# Patient Record
Sex: Female | Born: 1970
Health system: Southern US, Community
[De-identification: ages and names within clinical notes are randomized; demographics above are authoritative.]

## PROBLEM LIST (undated history)

## (undated) DIAGNOSIS — K219 Gastro-esophageal reflux disease without esophagitis: Secondary | ICD-10-CM

## (undated) DIAGNOSIS — G709 Myoneural disorder, unspecified: Secondary | ICD-10-CM

## (undated) DIAGNOSIS — IMO0002 Reserved for concepts with insufficient information to code with codable children: Secondary | ICD-10-CM

## (undated) DIAGNOSIS — F419 Anxiety disorder, unspecified: Secondary | ICD-10-CM

## (undated) DIAGNOSIS — K5790 Diverticulosis of intestine, part unspecified, without perforation or abscess without bleeding: Secondary | ICD-10-CM

## (undated) HISTORY — DX: Reserved for concepts with insufficient information to code with codable children: IMO0002

## (undated) HISTORY — DX: Myoneural disorder, unspecified: G70.9

## (undated) HISTORY — DX: Gastro-esophageal reflux disease without esophagitis: K21.9

## (undated) HISTORY — DX: Anxiety disorder, unspecified: F41.9

## (undated) HISTORY — PX: TUBAL LIGATION: SHX77

## (undated) HISTORY — DX: Diverticulosis of intestine, part unspecified, without perforation or abscess without bleeding: K57.90

---

## 2001-01-15 ENCOUNTER — Encounter: Payer: Self-pay | Admitting: Emergency Medicine

## 2001-01-15 ENCOUNTER — Emergency Department (HOSPITAL_COMMUNITY): Admission: EM | Admit: 2001-01-15 | Discharge: 2001-01-15 | Payer: Self-pay | Admitting: Emergency Medicine

## 2001-10-23 ENCOUNTER — Ambulatory Visit (HOSPITAL_COMMUNITY): Admission: RE | Admit: 2001-10-23 | Discharge: 2001-10-23 | Payer: Self-pay | Admitting: Internal Medicine

## 2001-10-23 ENCOUNTER — Encounter: Payer: Self-pay | Admitting: Internal Medicine

## 2001-10-30 ENCOUNTER — Other Ambulatory Visit: Admission: RE | Admit: 2001-10-30 | Discharge: 2001-10-30 | Payer: Self-pay | Admitting: Internal Medicine

## 2004-07-21 ENCOUNTER — Other Ambulatory Visit: Admission: RE | Admit: 2004-07-21 | Discharge: 2004-07-21 | Payer: Self-pay | Admitting: Internal Medicine

## 2005-03-13 ENCOUNTER — Encounter: Admission: RE | Admit: 2005-03-13 | Discharge: 2005-03-13 | Payer: Self-pay | Admitting: Orthopedic Surgery

## 2005-03-27 ENCOUNTER — Encounter: Admission: RE | Admit: 2005-03-27 | Discharge: 2005-03-27 | Payer: Self-pay | Admitting: Orthopedic Surgery

## 2005-04-24 ENCOUNTER — Encounter: Admission: RE | Admit: 2005-04-24 | Discharge: 2005-04-24 | Payer: Self-pay | Admitting: Orthopedic Surgery

## 2005-06-08 ENCOUNTER — Encounter: Admission: RE | Admit: 2005-06-08 | Discharge: 2005-06-08 | Payer: Self-pay | Admitting: Orthopedic Surgery

## 2005-06-19 HISTORY — PX: SPINE SURGERY: SHX786

## 2005-09-28 ENCOUNTER — Ambulatory Visit (HOSPITAL_COMMUNITY): Admission: RE | Admit: 2005-09-28 | Discharge: 2005-09-28 | Payer: Self-pay | Admitting: Neurological Surgery

## 2007-08-30 ENCOUNTER — Encounter: Admission: RE | Admit: 2007-08-30 | Discharge: 2007-08-30 | Payer: Self-pay | Admitting: Internal Medicine

## 2010-11-04 NOTE — Op Note (Signed)
NAME:  Brenda Wilson, Brenda Wilson             ACCOUNT NO.:  [id number]   MEDICAL RECORD NO.:  [id number]          PATIENT TYPE:  AMB   LOCATION:  SDS                          FACILITY:  [facility]   PHYSICIAN:  Tia Alert, MD     DATE OF BIRTH:  03-08-1971   DATE OF PROCEDURE:  09/28/2005  DATE OF DISCHARGE:                                 OPERATIVE REPORT   PREOPERATIVE DIAGNOSES:  Lumbar spinal stenosis with lumbar disk herniation,  L4-5 with left leg pain.   POSTOPERATIVE DIAGNOSES:  Lumbar spinal stenosis with lumbar disk  herniation, L4-5 with left leg pain.   OPERATION PERFORMED:  Lumbar decompressive hemilaminectomy, medial  facetectomy, foraminotomy followed by microdiskectomy, L4-5 on the left  utilizing microscopic dissection.   SURGEON:  Tia Alert, MD   ASSISTANT:  Reinaldo Meeker, M.D.   ANESTHESIA:  General endotracheal.   COMPLICATIONS:  None apparent.   INDICATIONS FOR PROCEDURE:  Brenda Wilson is a 40 year old white female who  presented with a long history of left leg pain.  She had an MRI which showed  spinal stenosis at L4-5 from ligamentum flavum overgrowth, facet hypertrophy  and a broad based disk bulge.  She had tried medical management for quite  some time without significant relief.  I recommended a lumbar decompressive  laminectomy followed by microdiskectomy at L4-5 on the left.  The patient  understood the risks, benefits and expected outcome and wished to proceed.   DESCRIPTION OF PROCEDURE:  The patient was taken to the operating room and  after induction of adequate general endotracheal anesthesia, was rolled into  the prone position over the Wilson frame and all pressure points were  padded.  The lumbar region was prepped with DuraPrep and then draped in the  usual sterile fashion.  8 mL of local anesthesia was injected and a dorsal  midline incision was made and carried down to the lumbosacral fascia.  The  fascia was opened on the patient's left  side and taken down in a  subperiosteal fashion to expose L4-5 interspace on the left. Intraoperative  x-ray confirmed our level and then hemilaminectomy, medial facetectomy and  foraminotomy was performed on the left side utilizing Kerrison punches and  high speed drill.  The yellow ligament was opened and removed in piecemeal  fashion to expose the underlying dura and L5 nerve root.  I dissected down  into the foramen past the level of the pedicle and along the medial pedicle  wall to perform a decompression.  I also drilled up under the spinous  process and decompressed the central canal by removing the yellow ligament  from the central canal in a sublaminar fashion.  I was then able to retract  the nerve root medially and coagulate the epidural venous vasculature.  I  found a subannular disk herniation which I incised with a 15 blade scalpel  and performed a thorough intradiskal diskectomy with pituitary rongeurs and  curettes.  Once the diskectomy was completed, I palpated with a nerve root  into the midline and into the foramen to assure adequate decompression.  The  annulus was sagging in the midline.  Felt like we had a good decompression  of the central canal and of the left lateral recess and the L5 nerve root.  We then irrigated with saline solution which contained bacitracin, lined the  dura with Duragen and Gelfoam and then checked for any bleeding points after  we removed the retractor and then closed the fascia with interrupted #1  Vicryl, closed the subcutaneous tissue and subcuticular tissue with 2-0 and  3-0 Vicryl and  closed the skin with Dermabond.  The drapes were removed.  The patient was  awakened from general anesthesia and transported to the recovery room in  stable condition.  At the end of the procedure, all sponge, needle and  instrument counts were correct.      Tia Alert, MD  Electronically Signed     DSJ/MEDQ  D:  09/28/2005  T:  09/28/2005   Job:  [id number]

## 2013-05-30 ENCOUNTER — Encounter: Payer: Self-pay | Admitting: Internal Medicine

## 2013-05-30 ENCOUNTER — Other Ambulatory Visit: Payer: Self-pay | Admitting: Internal Medicine

## 2013-05-30 DIAGNOSIS — M545 Low back pain, unspecified: Secondary | ICD-10-CM

## 2013-05-30 MED ORDER — CYCLOBENZAPRINE HCL 10 MG PO TABS: ORAL_TABLET | ORAL | Status: DC

## 2013-06-06 ENCOUNTER — Other Ambulatory Visit: Payer: Self-pay | Admitting: Physician Assistant

## 2013-06-06 NOTE — Telephone Encounter (Signed)
RX called in .

## 2013-07-03 ENCOUNTER — Encounter: Payer: Self-pay | Admitting: Physician Assistant

## 2013-07-28 ENCOUNTER — Other Ambulatory Visit: Payer: Self-pay | Admitting: Physician Assistant

## 2013-07-30 ENCOUNTER — Ambulatory Visit (INDEPENDENT_AMBULATORY_CARE_PROVIDER_SITE_OTHER): Payer: PRIVATE HEALTH INSURANCE | Admitting: Physician Assistant

## 2013-07-30 ENCOUNTER — Encounter: Payer: Self-pay | Admitting: Physician Assistant

## 2013-07-30 ENCOUNTER — Other Ambulatory Visit (HOSPITAL_COMMUNITY)
Admission: RE | Admit: 2013-07-30 | Discharge: 2013-07-30 | Disposition: A | Discharge status: Home / Self Care | Payer: PRIVATE HEALTH INSURANCE | Source: Ambulatory Visit | Attending: Physician Assistant | Admitting: Physician Assistant

## 2013-07-30 VITALS — BP 98/52 | HR 72 | Temp 98.2°F | Resp 16 | Ht 64.5 in | Wt 132.8 lb

## 2013-07-30 DIAGNOSIS — M109 Gout, unspecified: Secondary | ICD-10-CM

## 2013-07-30 DIAGNOSIS — Z124 Encounter for screening for malignant neoplasm of cervix: Secondary | ICD-10-CM

## 2013-07-30 DIAGNOSIS — N3000 Acute cystitis without hematuria: Secondary | ICD-10-CM

## 2013-07-30 DIAGNOSIS — Z0000 Encounter for general adult medical examination without abnormal findings: Secondary | ICD-10-CM

## 2013-07-30 DIAGNOSIS — Z1151 Encounter for screening for human papillomavirus (HPV): Secondary | ICD-10-CM | POA: Insufficient documentation

## 2013-07-30 DIAGNOSIS — Z01419 Encounter for gynecological examination (general) (routine) without abnormal findings: Secondary | ICD-10-CM | POA: Insufficient documentation

## 2013-07-30 DIAGNOSIS — R748 Abnormal levels of other serum enzymes: Secondary | ICD-10-CM

## 2013-07-30 DIAGNOSIS — Z23 Encounter for immunization: Secondary | ICD-10-CM

## 2013-07-30 LAB — LIPID PANEL
Cholesterol: 156 mg/dL (ref 0–200)
HDL: 58 mg/dL (ref >39)
LDL CALC: 73 mg/dL (ref 0–99)
TRIGLYCERIDES: 127 mg/dL (ref <150)
Total CHOL/HDL Ratio: 2.7 Ratio
VLDL: 25 mg/dL (ref 0–40)

## 2013-07-30 LAB — HEPATIC FUNCTION PANEL
ALBUMIN: 4.2 g/dL (ref 3.5–5.2)
ALT: 26 U/L (ref 0–35)
AST: 31 U/L (ref 0–37)
Alkaline Phosphatase: 62 U/L (ref 39–117)
Bilirubin, Direct: 0.1 mg/dL (ref 0.0–0.3)
Indirect Bilirubin: 0.2 mg/dL (ref 0.2–1.2)
TOTAL PROTEIN: 6.6 g/dL (ref 6.0–8.3)
Total Bilirubin: 0.3 mg/dL (ref 0.2–1.2)

## 2013-07-30 LAB — CBC WITH DIFFERENTIAL/PLATELET
BASOS ABS: 0 10*3/uL (ref 0.0–0.1)
Basophils Relative: 1 % (ref 0–1)
EOS ABS: 0.1 10*3/uL (ref 0.0–0.7)
Eosinophils Relative: 2 % (ref 0–5)
HEMATOCRIT: 39.4 % (ref 36.0–46.0)
Hemoglobin: 13.2 g/dL (ref 12.0–15.0)
LYMPHS ABS: 1.3 10*3/uL (ref 0.7–4.0)
LYMPHS PCT: 30 % (ref 12–46)
MCH: 28.6 pg (ref 26.0–34.0)
MCHC: 33.5 g/dL (ref 30.0–36.0)
MCV: 85.5 fL (ref 78.0–100.0)
MONO ABS: 0.2 10*3/uL (ref 0.1–1.0)
Monocytes Relative: 6 % (ref 3–12)
NEUTROS ABS: 2.6 10*3/uL (ref 1.7–7.7)
Neutrophils Relative %: 61 % (ref 43–77)
Platelets: 239 10*3/uL (ref 150–400)
RBC: 4.61 MIL/uL (ref 3.87–5.11)
RDW: 13.7 % (ref 11.5–15.5)
WBC: 4.2 10*3/uL (ref 4.0–10.5)

## 2013-07-30 LAB — IRON AND TIBC
%SAT: 17 % — AB (ref 20–55)
Iron: 54 ug/dL (ref 42–145)
TIBC: 319 ug/dL (ref 250–470)
UIBC: 265 ug/dL (ref 125–400)

## 2013-07-30 LAB — HEPATITIS B CORE ANTIBODY, TOTAL: HEP B C TOTAL AB: NONREACTIVE

## 2013-07-30 LAB — BASIC METABOLIC PANEL WITH GFR
BUN: 6 mg/dL (ref 6–23)
CALCIUM: 8.9 mg/dL (ref 8.4–10.5)
CHLORIDE: 106 meq/L (ref 96–112)
CO2: 26 mEq/L (ref 19–32)
Creat: 0.66 mg/dL (ref 0.50–1.10)
GFR, Est African American: >89 mL/min
GLUCOSE: 86 mg/dL (ref 70–99)
Potassium: 4.2 mEq/L (ref 3.5–5.3)
SODIUM: 139 meq/L (ref 135–145)

## 2013-07-30 LAB — MAGNESIUM: MAGNESIUM: 2.1 mg/dL (ref 1.5–2.5)

## 2013-07-30 LAB — HEPATITIS A ANTIBODY, TOTAL: HEP A TOTAL AB: NONREACTIVE

## 2013-07-30 LAB — TSH: TSH: 1.584 u[IU]/mL (ref 0.350–4.500)

## 2013-07-30 LAB — VITAMIN B12: VITAMIN B 12: 613 pg/mL (ref 211–911)

## 2013-07-30 LAB — FERRITIN: Ferritin: 32 ng/mL (ref 10–291)

## 2013-07-30 LAB — HEMOGLOBIN A1C
Hgb A1c MFr Bld: 5.4 % (ref <5.7)
MEAN PLASMA GLUCOSE: 108 mg/dL (ref <117)

## 2013-07-30 LAB — HEPATITIS C ANTIBODY: HCV Ab: NEGATIVE

## 2013-07-30 LAB — URIC ACID: Uric Acid, Serum: 3.1 mg/dL (ref 2.4–7.0)

## 2013-07-30 LAB — HEPATITIS B SURFACE ANTIBODY,QUALITATIVE: Hep B S Ab: NEGATIVE

## 2013-07-30 NOTE — Progress Notes (Signed)
Complete Physical HPI 43 y.o. female  presents for a complete physical. She has not had a physical in several years.  Her blood pressure has been controlled at home, today their BP is BP: 98/52 mmHg She denies chest pain, shortness of breath, dizziness.  Her cholesterol is diet controlled.  Her cholesterol is controlled. The cholesterol last visit was: 6797  She has been working on diet and exercise for prediabetes, and denies blurry vision, polydipsia, polyphagia and polyuria. Last A1C in the office was: 5.7 Patient is on Vitamin D supplement. 27.9 LMP 3 weeks ago. + sexually active but has TBL  Current Medications:  Current Outpatient Prescriptions on File Prior to Visit  Medication Sig Dispense Refill  . acyclovir (ZOVIRAX) 800 MG tablet Take 800 mg by mouth as needed.      . ALPRAZolam (XANAX) 0.5 MG tablet TAKE ONE TABLET BY MOUTH THREE TIMES DAILY AS NEEDED  90 tablet  0  . cyclobenzaprine (FLEXERIL) 10 MG tablet 1/2 to 1 tablet 3 x daily for muscle spasm  90 tablet  0   No current facility-administered medications on file prior to visit.   Health Maintenance:  Tetanus: 1998 Pneumovax: N/A Flu vaccine: declines Zostavax: N/A Pap: 2009 history of abnormal pap 20+ years ago, normal since then.  MGM: 08/2007 DEXA: N/A Colonoscopy: N/A EGD: N/A  Allergies: No Known Allergies Medical History:  Past Medical History  Diagnosis Date  . DDD (degenerative disc disease)    Surgical History: No past surgical history on file. Family History:  Family History  Problem Relation Age of Onset  . Cancer Father   . Melanoma Sister   . Cancer Paternal Uncle   . Melanoma Paternal Uncle   . Cancer Paternal Grandmother    Social History:  History   Social History  . Marital Status: Married    Spouse Name: N/A    Number of Children: N/A  . Years of Education: N/A   Occupational History  . Not on file.   Social History Main Topics  . Smoking status: Never Smoker   . Smokeless  tobacco: Not on file  . Alcohol Use: No  . Drug Use: No  . Sexual Activity: Not on file   Other Topics Concern  . Not on file   Social History Narrative  . No narrative on file   ROS Constitutional: Denies weight loss/gain, headaches, insomnia, fatigue, night sweats, and change in appetite. Eyes: Denies redness, blurred vision, diplopia, discharge, itchy, watery eyes.  ENT: + decreased hearing Denies discharge, congestion, post nasal drip, sore throat, earache, hearing loss, dental pain, Tinnitus, Vertigo, Sinus pain, snoring.  Cardio: Denies chest pain, palpitations, irregular heartbeat, dyspnea, diaphoresis, orthopnea, PND, claudication, edema Respiratory: denies cough, dyspnea, pleurisy, hoarseness, wheezing.  Gastrointestinal: + GERD occ, hemorrhoids Denies dysphagia, heartburn, pain, cramps, nausea, vomiting, bloating, diarrhea, constipation, hematemesis, melena, hematochezia, Genitourinary: Denies dysuria, frequency, urgency, nocturia, hesitancy, discharge, hematuria, flank pain Breast: Denies Breast lumps, nipple discharge, bleeding.  Musculoskeletal: + LBP - states took husbands med Colchicine for gout Denies arthralgia, myalgia, stiffness, Jt. Swelling, pain, Skin: Denies pruritis, rash, hives,  acne, eczema, changing in skin lesion Neuro: + paresthesia bilateral legs- states has had tests Denies Weakness, tremor, incoordination, spasms, pain Psychiatric: Denies confusion, memory loss, sensory loss Endocrine: Denies change in weight, skin, hair change, nocturia,  Diabetic Denies Polys, visual blurring, hyper /hypo glycemic episodes.  Heme/Lymph: Denies Excessive bleeding, bruising, enlarged lymph nodes  Physical Exam: Estimated body mass index is 22.45 kg/(m^2)  as calculated from the following:   Height as of this encounter: 5' 4.5" (1.638 m).   Weight as of this encounter: 132 lb 12.8 oz (60.238 kg). Filed Vitals:   07/30/13 1024  BP: 98/52  Pulse: 72  Temp: 98.2 F (36.8  C)  Resp: 16   General Appearance: Well nourished, in no apparent distress. Eyes: PERRLA, EOMs, conjunctiva no swelling or erythema, normal fundi and vessels. Sinuses: No Frontal/maxillary tenderness ENT/Mouth: Ext aud canals clear, normal light reflex with TMs without erythema, bulging.  Good dentition. No erythema, swelling, or exudate on post pharynx. Tonsils not swollen or erythematous. Hearing normal.  Neck: Supple, thyroid normal. No bruits Respiratory: Respiratory effort normal, BS equal bilaterally without rales, rhonchi, wheezing or stridor. Cardio: RRR without murmurs, rubs or gallops. Brisk peripheral pulses without edema.  Chest: symmetric, with normal excursions and percussion. Breasts: Symmetric, without lumps, nipple discharge, retractions. Abdomen: Soft, +BS. Non tender, no guarding, rebound, hernias, masses, or organomegaly. .  Lymphatics: Non tender without lymphadenopathy.  Genitourinary: normal external genitalia, vulva, vagina, cervix, uterus and adnexa, PAP: Pap smear done today. Musculoskeletal: Full ROM all peripheral extremities,5/5 strength, and normal gait. Skin: Warm, dry without rashes, lesions, ecchymosis.  Neuro: Cranial nerves intact, reflexes equal bilaterally. Normal muscle tone, no cerebellar symptoms. Sensation intact.  Psych: Awake and oriented X 3, normal affect, Insight and Judgment appropriate.   EKG: WNL no changes.  Assessment and Plan: DDD- back pain- states better with colchicine- check uric acid Herpes- cont acyclovir MGM- get screening PAP- sent off TDAP- given today Incontinence- check UTI, no cystocele, suggest Kegel.  Uric acid- check level Health Maintenance  Discussed med's effects and SE's. Screening labs and tests as requested with regular follow-up as recommended.   Quentin Mulling 10:28 AM

## 2013-07-30 NOTE — Patient Instructions (Signed)

## 2013-07-31 LAB — MICROALBUMIN / CREATININE URINE RATIO
Creatinine, Urine: 23.4 mg/dL
MICROALB UR: 0.5 mg/dL (ref 0.00–1.89)
MICROALB/CREAT RATIO: 21.4 mg/g (ref 0.0–30.0)

## 2013-07-31 LAB — URINALYSIS, ROUTINE W REFLEX MICROSCOPIC
BILIRUBIN URINE: NEGATIVE
Glucose, UA: NEGATIVE mg/dL
HGB URINE DIPSTICK: NEGATIVE
Ketones, ur: NEGATIVE mg/dL
LEUKOCYTES UA: NEGATIVE
NITRITE: NEGATIVE
PROTEIN: NEGATIVE mg/dL
Specific Gravity, Urine: <1.005 — ABNORMAL LOW (ref 1.005–1.030)
UROBILINOGEN UA: 0.2 mg/dL (ref 0.0–1.0)
pH: 8 (ref 5.0–8.0)

## 2013-07-31 LAB — URINE CULTURE
COLONY COUNT: NO GROWTH
Organism ID, Bacteria: NO GROWTH

## 2013-07-31 LAB — HEPATITIS B E ANTIBODY: Hepatitis Be Antibody: NEGATIVE

## 2013-07-31 LAB — INSULIN, FASTING: INSULIN FASTING, SERUM: 3 u[IU]/mL (ref 3–28)

## 2013-07-31 LAB — VITAMIN D 25 HYDROXY (VIT D DEFICIENCY, FRACTURES): Vit D, 25-Hydroxy: 62 ng/mL (ref 30–89)

## 2013-09-11 ENCOUNTER — Other Ambulatory Visit: Payer: Self-pay | Admitting: Emergency Medicine

## 2013-09-11 ENCOUNTER — Other Ambulatory Visit: Payer: Self-pay | Admitting: Physician Assistant

## 2013-12-10 ENCOUNTER — Other Ambulatory Visit: Payer: Self-pay | Admitting: Internal Medicine

## 2013-12-10 ENCOUNTER — Telehealth: Payer: Self-pay

## 2013-12-10 DIAGNOSIS — M5441 Lumbago with sciatica, right side: Secondary | ICD-10-CM

## 2013-12-10 DIAGNOSIS — M5442 Lumbago with sciatica, left side: Principal | ICD-10-CM

## 2013-12-10 NOTE — Telephone Encounter (Signed)
Marchelle Folksmanda what do you think - you see her for CPE's

## 2013-12-10 NOTE — Telephone Encounter (Signed)
Patient called and needs Xanax refill [facility] Patient is requesting Rx for muscle relaxer for chronic back pain. Patient states she has used Flexeril in the past.

## 2013-12-11 MED ORDER — CYCLOBENZAPRINE HCL 10 MG PO TABS: ORAL_TABLET | ORAL | Status: DC

## 2013-12-11 MED ORDER — ALPRAZOLAM 0.5 MG PO TABS: ORAL_TABLET | ORAL | Status: DC

## 2014-01-09 ENCOUNTER — Other Ambulatory Visit: Payer: Self-pay | Admitting: Physician Assistant

## 2014-01-13 ENCOUNTER — Other Ambulatory Visit: Payer: Self-pay | Admitting: Physician Assistant

## 2014-02-21 ENCOUNTER — Other Ambulatory Visit: Payer: Self-pay | Admitting: Physician Assistant

## 2014-05-23 ENCOUNTER — Other Ambulatory Visit: Payer: Self-pay | Admitting: Physician Assistant

## 2014-07-30 ENCOUNTER — Encounter: Payer: Self-pay | Admitting: Physician Assistant

## 2014-08-19 ENCOUNTER — Encounter: Payer: Self-pay | Admitting: Physician Assistant

## 2014-10-08 ENCOUNTER — Ambulatory Visit (INDEPENDENT_AMBULATORY_CARE_PROVIDER_SITE_OTHER): Payer: Commercial Managed Care - PPO | Admitting: Physician Assistant

## 2014-10-08 ENCOUNTER — Encounter: Payer: Self-pay | Admitting: Physician Assistant

## 2014-10-08 VITALS — BP 110/60 | HR 76 | Temp 97.9°F | Resp 16 | Ht 64.5 in | Wt 134.0 lb

## 2014-10-08 DIAGNOSIS — E785 Hyperlipidemia, unspecified: Secondary | ICD-10-CM

## 2014-10-08 DIAGNOSIS — Z79899 Other long term (current) drug therapy: Secondary | ICD-10-CM

## 2014-10-08 DIAGNOSIS — R5383 Other fatigue: Secondary | ICD-10-CM

## 2014-10-08 DIAGNOSIS — Z0000 Encounter for general adult medical examination without abnormal findings: Secondary | ICD-10-CM

## 2014-10-08 DIAGNOSIS — M5442 Lumbago with sciatica, left side: Secondary | ICD-10-CM

## 2014-10-08 DIAGNOSIS — F411 Generalized anxiety disorder: Secondary | ICD-10-CM

## 2014-10-08 DIAGNOSIS — M5441 Lumbago with sciatica, right side: Secondary | ICD-10-CM

## 2014-10-08 DIAGNOSIS — E559 Vitamin D deficiency, unspecified: Secondary | ICD-10-CM

## 2014-10-08 DIAGNOSIS — R748 Abnormal levels of other serum enzymes: Secondary | ICD-10-CM

## 2014-10-08 DIAGNOSIS — I10 Essential (primary) hypertension: Secondary | ICD-10-CM

## 2014-10-08 LAB — BASIC METABOLIC PANEL WITH GFR
BUN: 9 mg/dL (ref 6–23)
CHLORIDE: 106 meq/L (ref 96–112)
CO2: 27 mEq/L (ref 19–32)
Calcium: 9.1 mg/dL (ref 8.4–10.5)
Creat: 0.65 mg/dL (ref 0.50–1.10)
GFR, Est African American: >89 mL/min
Glucose, Bld: 89 mg/dL (ref 70–99)
POTASSIUM: 4.4 meq/L (ref 3.5–5.3)
Sodium: 139 mEq/L (ref 135–145)

## 2014-10-08 LAB — HEPATIC FUNCTION PANEL
ALBUMIN: 4.1 g/dL (ref 3.5–5.2)
ALK PHOS: 64 U/L (ref 39–117)
ALT: 17 U/L (ref 0–35)
AST: 20 U/L (ref 0–37)
BILIRUBIN INDIRECT: 0.3 mg/dL (ref 0.2–1.2)
BILIRUBIN TOTAL: 0.4 mg/dL (ref 0.2–1.2)
Bilirubin, Direct: 0.1 mg/dL (ref 0.0–0.3)
Total Protein: 6.7 g/dL (ref 6.0–8.3)

## 2014-10-08 LAB — CBC WITH DIFFERENTIAL/PLATELET
Basophils Absolute: 0 10*3/uL (ref 0.0–0.1)
Basophils Relative: 1 % (ref 0–1)
EOS ABS: 0 10*3/uL (ref 0.0–0.7)
Eosinophils Relative: 1 % (ref 0–5)
HEMATOCRIT: 39.6 % (ref 36.0–46.0)
Hemoglobin: 13.1 g/dL (ref 12.0–15.0)
LYMPHS ABS: 1.2 10*3/uL (ref 0.7–4.0)
Lymphocytes Relative: 28 % (ref 12–46)
MCH: 27.9 pg (ref 26.0–34.0)
MCHC: 33.1 g/dL (ref 30.0–36.0)
MCV: 84.3 fL (ref 78.0–100.0)
MPV: 9.6 fL (ref 8.6–12.4)
Monocytes Absolute: 0.3 10*3/uL (ref 0.1–1.0)
Monocytes Relative: 7 % (ref 3–12)
Neutro Abs: 2.8 10*3/uL (ref 1.7–7.7)
Neutrophils Relative %: 63 % (ref 43–77)
Platelets: 257 10*3/uL (ref 150–400)
RBC: 4.7 MIL/uL (ref 3.87–5.11)
RDW: 14.2 % (ref 11.5–15.5)
WBC: 4.4 10*3/uL (ref 4.0–10.5)

## 2014-10-08 LAB — LIPID PANEL
CHOLESTEROL: 159 mg/dL (ref 0–200)
HDL: 58 mg/dL (ref >=46)
LDL CALC: 83 mg/dL (ref 0–99)
TRIGLYCERIDES: 91 mg/dL (ref <150)
Total CHOL/HDL Ratio: 2.7 Ratio
VLDL: 18 mg/dL (ref 0–40)

## 2014-10-08 LAB — TSH: TSH: 0.86 u[IU]/mL (ref 0.350–4.500)

## 2014-10-08 LAB — IRON AND TIBC
%SAT: 29 % (ref 20–55)
Iron: 96 ug/dL (ref 42–145)
TIBC: 331 ug/dL (ref 250–470)
UIBC: 235 ug/dL (ref 125–400)

## 2014-10-08 LAB — MAGNESIUM: Magnesium: 2 mg/dL (ref 1.5–2.5)

## 2014-10-08 LAB — VITAMIN B12: Vitamin B-12: 533 pg/mL (ref 211–911)

## 2014-10-08 LAB — FERRITIN: Ferritin: 40 ng/mL (ref 10–291)

## 2014-10-08 NOTE — Patient Instructions (Addendum)
[facility]  7 a.m.-6:30 p.m., Monday 7 a.m.-5 p.m., Tuesday-Friday Schedule an appointment by calling [phone].   Can try melatonin 5mg -20 mg at night for sleep, can also do benadryl 25-50mg  at night for sleep.  If this does not help we can try prescription medication.  Also here is some information about good sleep hygiene.   Insomnia Insomnia is frequent trouble falling and/or staying asleep. Insomnia can be a long term problem or a short term problem. Both are common. Insomnia can be a short term problem when the wakefulness is related to a certain stress or worry. Long term insomnia is often related to ongoing stress during waking hours and/or poor sleeping habits. Overtime, sleep deprivation itself can make the problem worse. Every little thing feels more severe because you are overtired and your ability to cope is decreased. CAUSES   Stress, anxiety, and depression.  Poor sleeping habits.  Distractions such as TV in the bedroom.  Naps close to bedtime.  Engaging in emotionally charged conversations before bed.  Technical reading before sleep.  Alcohol and other sedatives. They may make the problem worse. They can hurt normal sleep patterns and normal dream activity.  Stimulants such as caffeine for several hours prior to bedtime.  Pain syndromes and shortness of breath can cause insomnia.  Exercise late at night.  Changing time zones may cause sleeping problems (jet lag). It is sometimes helpful to have someone observe your sleeping patterns. They should look for periods of not breathing during the night (sleep apnea). They should also look to see how long those periods last. If you live alone or observers are uncertain, you can also be observed at a sleep clinic where your sleep patterns will be professionally monitored. Sleep apnea requires a checkup and treatment. Give your caregivers your medical history. Give your caregivers  observations your family has made about your sleep.  SYMPTOMS   Not feeling rested in the morning.  Anxiety and restlessness at bedtime.  Difficulty falling and staying asleep. TREATMENT   Your caregiver may prescribe treatment for an underlying medical disorders. Your caregiver can give advice or help if you are using alcohol or other drugs for self-medication. Treatment of underlying problems will usually eliminate insomnia problems.  Medications can be prescribed for short time use. They are generally not recommended for lengthy use.  Over-the-counter sleep medicines are not recommended for lengthy use. They can be habit forming.  You can promote easier sleeping by making lifestyle changes such as:  Using relaxation techniques that help with breathing and reduce muscle tension.  Exercising earlier in the day.  Changing your diet and the time of your last meal. No night time snacks.  Establish a regular time to go to bed.  Counseling can help with stressful problems and worry.  Soothing music and white noise may be helpful if there are background noises you cannot remove.  Stop tedious detailed work at least one hour before bedtime. HOME CARE INSTRUCTIONS   Keep a diary. Inform your caregiver about your progress. This includes any medication side effects. See your caregiver regularly. Take note of:  Times when you are asleep.  Times when you are awake during the night.  The quality of your sleep.  How you feel the next day. This information will help your caregiver care for you.  Get out of bed if you are still awake after 15 minutes. Read or do some quiet activity. Keep the lights down. Wait  until you feel sleepy and go back to bed.  Keep regular sleeping and waking hours. Avoid naps.  Exercise regularly.  Avoid distractions at bedtime. Distractions include watching television or engaging in any intense or detailed activity like attempting to balance the household  checkbook.  Develop a bedtime ritual. Keep a familiar routine of bathing, brushing your teeth, climbing into bed at the same time each night, listening to soothing music. Routines increase the success of falling to sleep faster.  Use relaxation techniques. This can be using breathing and muscle tension release routines. It can also include visualizing peaceful scenes. You can also help control troubling or intruding thoughts by keeping your mind occupied with boring or repetitive thoughts like the old concept of counting sheep. You can make it more creative like imagining planting one beautiful flower after another in your backyard garden.  During your day, work to eliminate stress. When this is not possible use some of the previous suggestions to help reduce the anxiety that accompanies stressful situations. MAKE SURE YOU:   Understand these instructions.  Will watch your condition.  Will get help right away if you are not doing well or get worse. Document Released: 06/02/2000 Document Revised: 08/28/2011 Document Reviewed: 07/03/2007 [facility] Patient Information 2015 [place], [place]. This information is not intended to replace advice given to you by your health care provider. Make sure you discuss any questions you have with your health care provider.  Benefiber is good for constipation/diarrhea/irritable bowel syndrome, it helps with weight loss and can help lower your bad cholesterol. Please do 1-2 TBSP in the morning in water, coffee, or tea. It can take up to a month before you can see a difference with your bowel movements. It is cheapest from [employer], [employer], [employer].

## 2014-10-08 NOTE — Progress Notes (Signed)
Complete Physical  Assessment and Plan: 1. Routine general medical examination at a health care facility [facility]  2. Elevated liver enzymes - Hepatic function panel  3. Generalized anxiety disorder Anxiety-   stress management techniques discussed, increase water, good sleep hygiene discussed, increase exercise, and increase veggies.   4. Bilateral low back pain with sciatica, sciatica laterality unspecified Continue exercises  5. Hyperlipidemia - CBC with Differential/Platelet - BASIC METABOLIC PANEL WITH GFR - TSH - Lipid panel - Urinalysis, Routine w reflex microscopic - EKG 12-Lead  6. Other fatigue - Iron and TIBC - Ferritin - Vitamin B12  7. Vitamin D deficiency - Vit D  25 hydroxy (rtn osteoporosis monitoring)  8. Medication management - Magnesium  9. Atypical nevus With family history of melanoma will schedule removal  Discussed med's effects and SE's. Screening labs and tests as requested with regular follow-up as recommended. Over 40 minutes of exam, counseling, chart review, and critical decision making was performed this visit.   HPI  44 y.o. female  presents for a complete physical.  Her blood pressure has been controlled at home, today their BP is BP: 110/60 mmHg She does not workout. She denies chest pain, shortness of breath, dizziness.  She is not on cholesterol medication and denies myalgias. Her cholesterol is at goal. The cholesterol last visit was:   Lab Results  Component Value Date   CHOL 156 07/30/2013   HDL 58 07/30/2013   LDLCALC 73 07/30/2013   TRIG 127 07/30/2013   CHOLHDL 2.7 07/30/2013   Last W0JA1C in the office was:  Lab Results  Component Value Date   HGBA1C 5.4 07/30/2013  She has been on xanax for anxiety since her nephew passed when he was 7 but she has run out, she was just using it for sleep, she has been using melatonin but she will wake up a few hours later. She would not like to get a refill of xanax.  Patient is on  Vitamin D supplement.   Lab Results  Component Value Date   VD25OH 62 07/30/2013     Current Medications:  Current Outpatient Prescriptions on File Prior to Visit  Medication Sig Dispense Refill  . acyclovir (ZOVIRAX) 800 MG tablet Take 800 mg by mouth as needed.    . ALPRAZolam (XANAX) 0.5 MG tablet TAKE 1 TABLET THREE TIMES A DAY AS NEEDED 90 tablet 2  . cyclobenzaprine (FLEXERIL) 10 MG tablet TAKE 1/2 TO 1 TABLET THREE TIMES DAILY FOR MUSCLE SPASM 90 tablet 0   No current facility-administered medications on file prior to visit.   Health Maintenance:   Immunization History  Administered Date(s) Administered  . Tdap 07/30/2013   Tetanus: 2015 Pneumovax: N/A Prevnar 13: due 63 Flu vaccine: declines Zostavax: N/A Pap: 2015 history of abnormal pap 20+ years ago, normal since then- repeat 2018.  LMP: 3 weeks ago, sexually active with husband MGM: 08/2007 DUE DEXA: N/A Colonoscopy: N/A EGD: N/A  Patient Care Team: Lucky CowboyWilliam McKeown, MD as PCP - General (Internal Medicine)  Allergies: No Known Allergies Medical History:  Past Medical History  Diagnosis Date  . DDD (degenerative disc disease)   . Anxiety   . GERD (gastroesophageal reflux disease)    Surgical History:  Past Surgical History  Procedure Laterality Date  . Spine surgery  2007    L4-L5  . Tubal ligation     Family History:  Family History  Problem Relation Age of Onset  . Cancer Father   .  Melanoma Sister   . Cancer Paternal Uncle   . Melanoma Paternal Uncle   . Cancer Paternal Grandmother    Social History:  History  Substance Use Topics  . Smoking status: Never Smoker   . Smokeless tobacco: Not on file  . Alcohol Use: No    Review of Systems: Review of Systems  Constitutional: Positive for malaise/fatigue. Negative for fever, chills, weight loss and diaphoresis.  HENT: Positive for congestion. Negative for ear discharge, ear pain, hearing loss, nosebleeds, sore throat and tinnitus.   Eyes:  Negative.   Respiratory: Negative.  Negative for stridor.   Cardiovascular: Negative.   Gastrointestinal: Positive for heartburn and constipation (occ). Negative for nausea, vomiting, abdominal pain, diarrhea, blood in stool and melena.  Genitourinary: Negative.   Musculoskeletal: Positive for back pain (chronic).  Skin: Negative.   Neurological: Positive for sensory change. Negative for dizziness, tingling, tremors, speech change, focal weakness, seizures, loss of consciousness, weakness and headaches.  Psychiatric/Behavioral: Negative for depression, suicidal ideas, hallucinations, memory loss and substance abuse. The patient has insomnia. The patient is not nervous/anxious.     Physical Exam: Estimated body mass index is 22.65 kg/(m^2) as calculated from the following:   Height as of this encounter: 5' 4.5" (1.638 m).   Weight as of this encounter: 134 lb (60.782 kg). BP 110/60 mmHg  Pulse 76  Temp(Src) 97.9 F (36.6 C)  Resp 16  Ht 5' 4.5" (1.638 m)  Wt 134 lb (60.782 kg)  BMI 22.65 kg/m2 General Appearance: Well nourished, in no apparent distress.  Eyes: PERRLA, EOMs, conjunctiva no swelling or erythema, normal fundi and vessels.  Sinuses: No Frontal/maxillary tenderness  ENT/Mouth: Ext aud canals clear, normal light reflex with TMs without erythema, bulging. Good dentition. No erythema, swelling, or exudate on post pharynx. Tonsils not swollen or erythematous. Hearing normal.  Neck: Supple, thyroid normal. No bruits  Respiratory: Respiratory effort normal, BS equal bilaterally without rales, rhonchi, wheezing or stridor.  Cardio: RRR without murmurs, rubs or gallops. Brisk peripheral pulses without edema.  Chest: symmetric, with normal excursions and percussion.  Breasts: Symmetric, with lumps, without nipple discharge, retractions.  Abdomen: Soft, nontender, no guarding, rebound, hernias, masses, or organomegaly.  Lymphatics: Non tender without lymphadenopathy.   Genitourinary: defer Musculoskeletal: Full ROM all peripheral extremities,5/5 strength, and normal gait.  Skin: Warm, dry without rashes, lesions, ecchymosis. Skin: area on center right back, 2x32mm  Neuro: Cranial nerves intact, reflexes equal bilaterally. Normal muscle tone, no cerebellar symptoms. Sensation intact.  Psych: Awake and oriented X 3, normal affect, Insight and Judgment appropriate.   EKG: WNL no changes. AORTA SCAN: defer  Quentin Mulling 10:29 AM [facility]

## 2014-10-09 ENCOUNTER — Other Ambulatory Visit: Payer: Self-pay

## 2014-10-09 DIAGNOSIS — Z1231 Encounter for screening mammogram for malignant neoplasm of breast: Secondary | ICD-10-CM

## 2014-10-09 LAB — URINALYSIS, ROUTINE W REFLEX MICROSCOPIC
Bilirubin Urine: NEGATIVE
Glucose, UA: NEGATIVE mg/dL
Hgb urine dipstick: NEGATIVE
Ketones, ur: NEGATIVE mg/dL
Leukocytes, UA: NEGATIVE
Nitrite: NEGATIVE
PH: 6 (ref 5.0–8.0)
Protein, ur: NEGATIVE mg/dL
SPECIFIC GRAVITY, URINE: 1.005 (ref 1.005–1.030)
UROBILINOGEN UA: 0.2 mg/dL (ref 0.0–1.0)

## 2014-10-09 LAB — VITAMIN D 25 HYDROXY (VIT D DEFICIENCY, FRACTURES): VIT D 25 HYDROXY: 37 ng/mL (ref 30–100)

## 2014-10-29 ENCOUNTER — Ambulatory Visit: Payer: PRIVATE HEALTH INSURANCE

## 2014-11-19 ENCOUNTER — Ambulatory Visit: Payer: PRIVATE HEALTH INSURANCE

## 2015-01-08 ENCOUNTER — Encounter: Payer: Self-pay | Admitting: Physician Assistant

## 2015-02-04 ENCOUNTER — Other Ambulatory Visit: Payer: Self-pay | Admitting: *Deleted

## 2015-02-04 MED ORDER — ACYCLOVIR 800 MG PO TABS: 800.0000 mg | ORAL_TABLET | ORAL | Status: DC | PRN

## 2015-06-22 ENCOUNTER — Telehealth: Payer: Self-pay | Admitting: Internal Medicine

## 2015-06-22 NOTE — Telephone Encounter (Signed)
Requested Mammogram for rt breast pain, provider rec office visit to evaluate. Tried to leave voicemail, not set up.

## 2015-07-05 ENCOUNTER — Ambulatory Visit
Admission: RE | Admit: 2015-07-05 | Discharge: 2015-07-05 | Disposition: A | Discharge status: Home / Self Care | Payer: Commercial Managed Care - PPO | Source: Ambulatory Visit

## 2015-07-05 ENCOUNTER — Other Ambulatory Visit: Payer: Self-pay

## 2015-07-05 ENCOUNTER — Ambulatory Visit: Payer: PRIVATE HEALTH INSURANCE

## 2015-07-05 DIAGNOSIS — Z1231 Encounter for screening mammogram for malignant neoplasm of breast: Secondary | ICD-10-CM

## 2015-07-06 ENCOUNTER — Ambulatory Visit: Payer: PRIVATE HEALTH INSURANCE

## 2015-08-17 ENCOUNTER — Encounter: Payer: Self-pay | Admitting: Internal Medicine

## 2015-09-13 ENCOUNTER — Other Ambulatory Visit: Payer: Self-pay | Admitting: Internal Medicine

## 2015-09-13 MED ORDER — ALPRAZOLAM 0.5 MG PO TABS: 0.5000 mg | ORAL_TABLET | Freq: Three times a day (TID) | ORAL | Status: DC | PRN

## 2015-09-13 NOTE — Progress Notes (Signed)
[facility]

## 2015-10-11 ENCOUNTER — Encounter: Payer: Self-pay | Admitting: Physician Assistant

## 2015-10-25 ENCOUNTER — Other Ambulatory Visit: Payer: Self-pay | Admitting: Internal Medicine

## 2015-12-04 ENCOUNTER — Other Ambulatory Visit: Payer: Self-pay | Admitting: Internal Medicine

## 2016-02-18 ENCOUNTER — Other Ambulatory Visit: Payer: Self-pay | Admitting: Obstetrics & Gynecology

## 2016-02-23 LAB — CYTOLOGY - PAP

## 2016-08-15 ENCOUNTER — Other Ambulatory Visit: Payer: Self-pay | Admitting: Internal Medicine

## 2016-09-25 ENCOUNTER — Encounter: Payer: Self-pay | Admitting: *Deleted

## 2016-10-11 ENCOUNTER — Encounter: Payer: Self-pay | Admitting: Physician Assistant

## 2017-09-01 ENCOUNTER — Other Ambulatory Visit: Payer: Self-pay | Admitting: Internal Medicine

## 2017-09-02 NOTE — Progress Notes (Deleted)
Assessment and Plan:  There are no diagnoses linked to this encounter.    Further disposition pending results of labs. Discussed med's effects and SE's.   Over 30 minutes of exam, counseling, chart review, and critical decision making was performed.   Future Appointments  Date Time Provider Department Center  09/03/2017 11:00 AM Judd Gaudierorbett, Ayona Yniguez, NP [facility] None    ------------------------------------------------------------------------------------------------------------------   HPI There were no vitals taken for this visit.  47 y.o.female presents for c/o myalgias and ? Menopausal symptoms   Poor follow up, last seen 09/2014-  developing worsening hot flushes, night sweats, vaginal dryness, sleep disturbances, mood swings and arthralgias with muscle and joint pain.  These seem to be all be getting worse.  No hair or skin changes.  No diarrhea/constipation.  Has gained some weight over the past year.  TSH ***    with classic worsening menopausal symptoms.  Patient and I had a lengthy discussion about the perimenopause and various symptoms attributable to this.  She is beginning to find that these symptoms are intolerable.  We reviewed treatment options to include observation, OTC products, pharmacologic nonhormonal such as Effexor and HRT.  I reviewed the various studies pertaining to HRT to include the WHI study and the risks versus benefits.  Thrombosis such as stroke heart attack DVT as well as the breast cancer issue was reviewed.  Benefits to include symptom relief and possible cardiovascular and bone health as well as colon cancer reduction discussed.  Various preparations to include transdermal/transvaginal and oral were reviewed.  Combinations to include both estrogen and progesterone versus taking them separately also discussed and the need for progesterone to protect the uterus against unopposed estrogen reviewed.  After lengthy discussion the patient feels very strongly she  wants to start HRT.  Will initiate with estradiol 1 mg daily and Prometrium 100 mg nightly.  If she does not feel she is getting an adequate response after 30 days she will call for dose adjustment.  I refilled her through her October annual exam appointment assuming she does get good relief and is comfortable with this.  The issues of endometrial stimulation and cancer reviewed and the need to report any bleeding was discussed.    Past Medical History:  Diagnosis Date  . Anxiety   . DDD (degenerative disc disease)   . GERD (gastroesophageal reflux disease)      No Known Allergies  Current Outpatient Medications on File Prior to Visit  Medication Sig  . acyclovir (ZOVIRAX) 800 MG tablet TAKE 1 TABLET (800 MG TOTAL) BY MOUTH AS NEEDED.  Marland Kitchen. ALPRAZolam (XANAX) 0.5 MG tablet Take 1 tablet (0.5 mg total) by mouth 3 (three) times daily as needed. For anxiety  . cyclobenzaprine (FLEXERIL) 10 MG tablet TAKE 1/2 TO 1 TABLET THREE TIMES DAILY FOR MUSCLE SPASM   No current facility-administered medications on file prior to visit.     ROS: all negative except above.   Physical Exam:  There were no vitals taken for this visit.  General Appearance: Well nourished, in no apparent distress. Eyes: PERRLA, EOMs, conjunctiva no swelling or erythema Sinuses: No Frontal/maxillary tenderness ENT/Mouth: Ext aud canals clear, TMs without erythema, bulging. No erythema, swelling, or exudate on post pharynx.  Tonsils not swollen or erythematous. Hearing normal.  Neck: Supple, thyroid normal.  Respiratory: Respiratory effort normal, BS equal bilaterally without rales, rhonchi, wheezing or stridor.  Cardio: RRR with no MRGs. Brisk peripheral pulses without edema.  Abdomen: Soft, + BS.  Non tender, no  guarding, rebound, hernias, masses. Lymphatics: Non tender without lymphadenopathy.  Musculoskeletal: Full ROM, 5/5 strength, normal gait.  Skin: Warm, dry without rashes, lesions, ecchymosis.  Neuro: Cranial  nerves intact. Normal muscle tone, no cerebellar symptoms. Sensation intact.  Psych: Awake and oriented X 3, normal affect, Insight and Judgment appropriate.     Dan Maker, NP 9:20 AM [facility]

## 2017-09-03 ENCOUNTER — Ambulatory Visit: Payer: Self-pay | Admitting: Adult Health

## 2017-09-20 NOTE — Progress Notes (Signed)
Assessment and Plan:  Brenda Wilson was seen today for muscle pain.  Diagnoses and all orders for this visit:  Myalgia/ perceived peripheral muscle fatigue Unremarkable exam with normal neuro/muscular - Not on any concerning medications, statin; ? Rheumatic/autoimmune component - may need to follow this up further if overall unremarkable Vitamin D has been screened and not notably deficient - now on supplement Will r/o adrenal insufficiency Follow up pending lab results-  -     Sedimentation rate -     C-reactive protein -     Magnesium -     Aldolase -     BASIC METABOLIC PANEL WITH GFR -     LFT -     Urinalysis w microscopic + reflex cultur -     CK -     Cortisol -     ACTH  Family history of Addison's disease       -     BASIC METABOLIC PANEL WITH GFR -     Cortisol -     ACTH  Genetic carrier of other disease       -     ALD carrier -   Other orders Will trial a course of prednisone and flexeril for comfort and evaluate response -     predniSONE (DELTASONE) 20 MG tablet; 2 tablets daily for 3 days, 1 tablet daily for 4 days. -     cyclobenzaprine (FLEXERIL) 10 MG tablet; TAKE 1/2 TO 1 TABLET THREE TIMES DAILY FOR MUSCLE SPASM   Further disposition pending results of labs. Discussed med's effects and SE's.   Over 30 minutes of exam, counseling, chart review, and critical decision making was performed.   Future Appointments  Date Time Provider Department Center  03/28/2018  9:00 AM Judd Gaudierorbett, Roshunda Keir, NP [facility] None    ------------------------------------------------------------------------------------------------------------------   HPI BP 124/78   Pulse 81   Temp 98.1 F (36.7 C)   Wt 137 lb (62.1 kg)   SpO2 99%   BMI 23.15 kg/m   46 y.o.female , perimenopausal s/p tubal ligation, with hx of DDD and anxiety,  seen sporadically in the past at this clinic presents for evaluation of a "muscle problem." She reports she initially though related to perimenopause  (last cycle 4 months ago) and started having "hot flashes," particularly at night. She reports 3 months ago she began to note myalgias, bilateral/symmetrical, started in forearms and now having in all extremities. Describes as "hurt" or "ache" 7/10 - does have some ebbing, worse in the morning, having some diffiuclty with strength, feels weak trying to open a jar of pickles, lifting, getting out of her car has more pain and sensation of weakness. She also endorses mild AM nausea intermittently as well as bloating.  Denies waking up at night with pain. Does have insomnia but that is ongoing and unchanged.   She presents with labs drawn recently by another provider after she mentioned myalgias from 09/10/2017; CBC unremarkable, CMP demonstrates mildly elevated serum glucose, GFR 106, Cr 0.64, BUN 13, electrolytes unremarkable, liver functions normal except mild ALT elevation at 34. Vitamin D was drawn at that time at 31.9 - has started on 2000 IU daily supplement since. TSH 1.380. She has also been on magnesium supplement 250 mg tabs for some time.   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   BMI is Body mass index is 23.15 kg/m. Wt Readings from Last 3 Encounters:  09/21/17 137 lb (62.1 kg)  10/08/14 134 lb (60.8 kg)  07/30/13 132 lb 12.8 oz (60.2 kg)     Past Medical History:  Diagnosis Date  . Anxiety   . DDD (degenerative disc disease)   . GERD (gastroesophageal reflux disease)      No Known Allergies  Current Outpatient Medications on File Prior to Visit  Medication Sig  . acyclovir (ZOVIRAX) 800 MG tablet TAKE 1 TABLET (800 MG TOTAL) BY MOUTH AS NEEDED.  Marland Kitchen ALPRAZolam (XANAX) 0.5 MG tablet Take 1 tablet (0.5 mg total) by mouth 3 (three) times daily as needed. For anxiety  . Cholecalciferol (VITAMIN D3) 2000 units TABS Take by mouth daily.  . Magnesium 250 MG TABS Take 1 tablet by mouth daily.  . Multiple  Vitamins-Minerals (MULTIVITAMIN ADULT) CHEW Chew by mouth.   No current facility-administered medications on file prior to visit.     ROS: Review of Systems  Constitutional: Positive for diaphoresis ( "hot flashes" ) and malaise/fatigue (Mild fatigue). Negative for chills, fever and weight loss.  HENT: Negative.   Eyes: Negative.  Negative for blurred vision and double vision.  Respiratory: Negative for cough, sputum production, shortness of breath and wheezing.   Cardiovascular: Negative for chest pain, palpitations, orthopnea, claudication, leg swelling and PND.  Gastrointestinal: Positive for nausea. Negative for abdominal pain, blood in stool, constipation, diarrhea, melena and vomiting.  Genitourinary: Negative.   Musculoskeletal: Positive for back pain (Chronic, unchanged) and myalgias. Negative for falls, joint pain and neck pain.  Skin: Negative for rash.  Neurological: Positive for weakness (in arms and legs when walking). Negative for dizziness, tingling, sensory change and headaches.  Endo/Heme/Allergies: Negative for polydipsia. Does not bruise/bleed easily.  Psychiatric/Behavioral: Negative for depression, hallucinations, memory loss and substance abuse. The patient has insomnia (Some difficulty sleeping). The patient is not nervous/anxious.     Physical Exam:  BP 124/78   Pulse 81   Temp 98.1 F (36.7 C)   Wt 137 lb (62.1 kg)   SpO2 99%   BMI 23.15 kg/m   General Appearance: Well nourished, has dark tan (admits to recent use of tanning bed) in no apparent distress. Eyes: PERRLA, EOMs, conjunctiva no swelling or erythema Sinuses: No Frontal/maxillary tenderness ENT/Mouth: Ext aud canals clear, TMs without erythema, bulging. No erythema, swelling, or exudate on post pharynx.  Tonsils not swollen or erythematous. Hearing normal.  Neck: Supple, thyroid normal.  Respiratory: Respiratory effort normal, BS equal bilaterally without rales, rhonchi, wheezing or stridor.   Cardio: RRR with no MRGs. Brisk peripheral pulses without edema.  Abdomen: Soft, + BS.  Non tender, no guarding, rebound, hernias, masses. Lymphatics: Non tender without lymphadenopathy.  Musculoskeletal: Full ROM, 5/5 strength without strength fatige, normal gait.  Skin: Warm, dry without rashes, lesions, ecchymosis.  Neuro: Cranial nerves intact. Normal muscle tone, no cerebellar symptoms. Sensation intact. Reflexes grossly symmetrical and intact 2+ throughout.  Psych: Awake and oriented X 3, normal affect, Insight and Judgment appropriate.     Dan Maker, NP 11:39 AM [facility]

## 2017-09-21 ENCOUNTER — Encounter: Payer: Self-pay | Admitting: Adult Health

## 2017-09-21 ENCOUNTER — Ambulatory Visit: Payer: Commercial Managed Care - PPO | Admitting: Adult Health

## 2017-09-21 ENCOUNTER — Ambulatory Visit: Payer: Self-pay | Admitting: Internal Medicine

## 2017-09-21 VITALS — BP 124/78 | HR 81 | Temp 98.1°F | Wt 137.0 lb

## 2017-09-21 DIAGNOSIS — Z148 Genetic carrier of other disease: Secondary | ICD-10-CM

## 2017-09-21 DIAGNOSIS — M6289 Other specified disorders of muscle: Secondary | ICD-10-CM | POA: Diagnosis not present

## 2017-09-21 DIAGNOSIS — Z8349 Family history of other endocrine, nutritional and metabolic diseases: Secondary | ICD-10-CM | POA: Diagnosis not present

## 2017-09-21 DIAGNOSIS — R7303 Prediabetes: Secondary | ICD-10-CM

## 2017-09-21 DIAGNOSIS — M791 Myalgia, unspecified site: Secondary | ICD-10-CM | POA: Diagnosis not present

## 2017-09-21 DIAGNOSIS — R7309 Other abnormal glucose: Secondary | ICD-10-CM | POA: Insufficient documentation

## 2017-09-21 HISTORY — DX: Genetic carrier of other disease: Z14.8

## 2017-09-21 MED ORDER — CYCLOBENZAPRINE HCL 10 MG PO TABS: ORAL_TABLET | ORAL | 0 refills | Status: DC

## 2017-09-21 MED ORDER — PREDNISONE 20 MG PO TABS: ORAL_TABLET | ORAL | 0 refills | Status: DC

## 2017-09-21 NOTE — Patient Instructions (Signed)

## 2017-09-24 LAB — HEPATIC FUNCTION PANEL
AG Ratio: 1.7 (calc) (ref 1.0–2.5)
ALKALINE PHOSPHATASE (APISO): 85 U/L (ref 33–115)
ALT: 24 U/L (ref 6–29)
AST: 26 U/L (ref 10–35)
Albumin: 4.5 g/dL (ref 3.6–5.1)
BILIRUBIN TOTAL: 0.5 mg/dL (ref 0.2–1.2)
Bilirubin, Direct: 0.1 mg/dL (ref 0.0–0.2)
Globulin: 2.6 g/dL (calc) (ref 1.9–3.7)
Indirect Bilirubin: 0.4 mg/dL (calc) (ref 0.2–1.2)
TOTAL PROTEIN: 7.1 g/dL (ref 6.1–8.1)

## 2017-09-24 LAB — URINALYSIS W MICROSCOPIC + REFLEX CULTURE
Bacteria, UA: NONE SEEN /HPF
Bilirubin Urine: NEGATIVE
GLUCOSE, UA: NEGATIVE
HGB URINE DIPSTICK: NEGATIVE
Hyaline Cast: NONE SEEN /LPF
KETONES UR: NEGATIVE
Nitrites, Initial: NEGATIVE
PH: 7 (ref 5.0–8.0)
Protein, ur: NEGATIVE
RBC / HPF: NONE SEEN /HPF (ref 0–2)
SPECIFIC GRAVITY, URINE: 1.009 (ref 1.001–1.03)

## 2017-09-24 LAB — URINE CULTURE
MICRO NUMBER:: 90427660
SPECIMEN QUALITY:: ADEQUATE

## 2017-09-24 LAB — BASIC METABOLIC PANEL WITH GFR
BUN: 10 mg/dL (ref 7–25)
CALCIUM: 9.5 mg/dL (ref 8.6–10.2)
CHLORIDE: 105 mmol/L (ref 98–110)
CO2: 29 mmol/L (ref 20–32)
Creat: 0.69 mg/dL (ref 0.50–1.10)
GFR, EST AFRICAN AMERICAN: 119 mL/min/{1.73_m2} (ref >=60)
GFR, Est Non African American: 103 mL/min/{1.73_m2} (ref >=60)
Glucose, Bld: 101 mg/dL — ABNORMAL HIGH (ref 65–99)
POTASSIUM: 4.1 mmol/L (ref 3.5–5.3)
Sodium: 140 mmol/L (ref 135–146)

## 2017-09-24 LAB — CULTURE INDICATED

## 2017-09-24 LAB — CORTISOL: Cortisol, Plasma: 11.3 ug/dL

## 2017-09-24 LAB — SEDIMENTATION RATE: SED RATE: 6 mm/h (ref 0–20)

## 2017-09-24 LAB — MAGNESIUM: MAGNESIUM: 2.1 mg/dL (ref 1.5–2.5)

## 2017-09-24 LAB — CK: Total CK: 46 U/L (ref 29–143)

## 2017-09-24 LAB — ACTH: C206 ACTH: 16 pg/mL (ref 6–50)

## 2017-09-24 LAB — C-REACTIVE PROTEIN: CRP: 0.2 mg/L (ref <8.0)

## 2017-09-24 LAB — ALDOLASE: ALDOLASE: 3.5 U/L (ref <=8.1)

## 2017-09-26 ENCOUNTER — Other Ambulatory Visit: Payer: Self-pay

## 2017-09-26 MED ORDER — NITROFURANTOIN MONOHYD MACRO 100 MG PO CAPS: 100.0000 mg | ORAL_CAPSULE | Freq: Two times a day (BID) | ORAL | 0 refills | Status: AC

## 2017-10-25 ENCOUNTER — Other Ambulatory Visit: Payer: Self-pay | Admitting: Internal Medicine

## 2017-11-05 ENCOUNTER — Telehealth: Payer: Self-pay

## 2017-11-05 ENCOUNTER — Other Ambulatory Visit: Payer: Self-pay | Admitting: Adult Health

## 2017-11-05 MED ORDER — PREDNISONE 20 MG PO TABS: ORAL_TABLET | ORAL | 0 refills | Status: DC

## 2017-11-05 NOTE — Telephone Encounter (Signed)
Patient is out of town and having problems with muscle aches again. Muscle relaxers did not help. Prednisone gave her relief. Will you call in an rx for Prednisone.

## 2017-11-06 ENCOUNTER — Other Ambulatory Visit: Payer: Self-pay

## 2017-11-06 MED ORDER — PREDNISONE 20 MG PO TABS: ORAL_TABLET | ORAL | 0 refills | Status: DC

## 2017-11-06 NOTE — Telephone Encounter (Signed)
Prednisone sent to the wrong pharmacy. Please resend

## 2018-03-28 ENCOUNTER — Encounter: Payer: Self-pay | Admitting: Adult Health

## 2018-06-24 ENCOUNTER — Other Ambulatory Visit: Payer: Self-pay | Admitting: Physician Assistant

## 2018-09-19 ENCOUNTER — Other Ambulatory Visit: Payer: Self-pay

## 2018-09-19 ENCOUNTER — Ambulatory Visit: Payer: Self-pay | Admitting: Adult Health

## 2018-09-19 ENCOUNTER — Encounter: Payer: Self-pay | Admitting: Adult Health

## 2018-09-19 VITALS — BP 111/69 | HR 94 | Wt 129.0 lb

## 2018-09-19 DIAGNOSIS — F419 Anxiety disorder, unspecified: Secondary | ICD-10-CM

## 2018-09-19 DIAGNOSIS — R071 Chest pain on breathing: Secondary | ICD-10-CM

## 2018-09-19 DIAGNOSIS — R0789 Other chest pain: Secondary | ICD-10-CM

## 2018-09-19 MED ORDER — ALPRAZOLAM 0.5 MG PO TABS
ORAL_TABLET | ORAL | 0 refills | Status: DC
Start: 2018-09-19 — End: 2018-10-31

## 2018-09-19 MED ORDER — ALPRAZOLAM 0.5 MG PO TABS: 0.5000 mg | ORAL_TABLET | Freq: Three times a day (TID) | ORAL | 0 refills | Status: DC | PRN

## 2018-09-19 MED ORDER — BUSPIRONE HCL 10 MG PO TABS: ORAL_TABLET | ORAL | 0 refills | Status: DC

## 2018-09-19 NOTE — Progress Notes (Signed)
Virtual Visit via Telephone Note  I connected with Brenda Wilson on 09/19/18 at 10:30 AM EDT by telephone and verified that I am speaking with the correct person using two identifiers.   I discussed the limitations, risks, security and privacy concerns of performing an evaluation and management service by telephone and the availability of in person appointments. I also discussed with the patient that there may be a patient responsible charge related to this service. The patient expressed understanding and agreed to proceed.   History of Present Illness:  BP 111/69   Pulse 94   Wt 129 lb (58.5 kg)   BMI 21.80 kg/m   48 y.o. with hx of anxiety, prediabetes, otherwise benign medical history calls to report chest tightness and tenderness.   She reports 1 week ago she was helping her daughter move bags of clothing, noted the next day she noted an aching in the center of her chest and generalized neck/shoulder tightness and mild aching (feels like needs a massage). She reports several days later she was feeling short of breath walking up stairs and became very concerned. She describes as sensation of "need to take an extra deep breath." She reports chest pain as sharp, localized, at center of chest between breasts and sharp when palpated. Pain is worse with movement of upper extremities. Denies fatigue/malaise, dizziness, palpitations.   She has low risk cardiovascular history; no family history; no personal hx of htn, hyperlipidemia. She has no hx of clotting disorder, no hx of DVT/PE, not on HBC, no recent travel. No leg/calf pain recently.   She bursts out in tears halfway through our visit; she feels this may be sx of anxiety; has hx of this, self tapered off of xanax, but under a lot of stress with covid 19, lost job, doesn't have insurance anymore and with recent move. She is having difficulty sleeping as well. She scores 11 on GAD scale, PHQ-9 of 7.   Observations/Objective:  General :  Well sounding patient in no acute distress, sounds stressed HEENT: no hoarseness, no cough for duration of visit Lungs: speaks in complete sentences, no audible wheezing, no apparent distress Neurological: alert, oriented x 3 Psychiatric: pleasant, mildly pressed speech initially, tearful for half of visit, judgement is appropriate    Assessment and Plan:   Costochondral chest pain Very low risk history patient with chest pain HPI most consistent with MSK origin Reassured patient, she is in agreement based on our conversation that chest pain most likely combination of anxiety and MSK strain/inflammation She declines prescription, suggested she trial OTC analgesics Go to the ER if any worsening chest pain, shortness of breath, nausea, dizziness, fatigue/malaise, LE edema   Anxiety Start new medication as prescribed Stress management techniques discussed, increase water, good sleep hygiene discussed, increase exercise, and increase veggies.  Follow up 6-8 weeks, call the office if any new AE's from medications and we will switch them -     busPIRone (BUSPAR) 10 MG tablet; Start with 1/2 tab twice a day x 1 week; increase to three times a day x 1 week; then full tab twice daily. -     ALPRAZolam (XANAX) 0.5 MG tablet; Take 1/2-1 tab up to three times a day only if needed for severe anxiety or panic attacks. Do not take with alcohol or sedating medications.   Follow Up Instructions:    I discussed the assessment and treatment plan with the patient. The patient was provided an opportunity to ask questions and all  were answered. The patient agreed with the plan and demonstrated an understanding of the instructions.   The patient was advised to call back or seek an in-person evaluation if the symptoms worsen or if the condition fails to improve as anticipated.  I provided 25 minutes of non-face-to-face time during this encounter.   Dan Maker, NP

## 2018-09-24 ENCOUNTER — Telehealth: Payer: Self-pay

## 2018-09-24 NOTE — Telephone Encounter (Signed)
Update: feeling better with new anxiety meds.

## 2018-10-30 NOTE — Progress Notes (Signed)
Virtual Visit via Telephone Note  I connected with Burman Fostereresa P Fischel on 10/31/18 at 10:00 AM EDT by telephone and verified that I am speaking with the correct person using two identifiers.  Location: Patient: home  Provider: [facility] office   I discussed the limitations, risks, security and privacy concerns of performing an evaluation and management service by telephone and the availability of in person appointments. I also discussed with the patient that there may be a patient responsible charge related to this service. The patient expressed understanding and agreed to proceed.   I discussed the assessment and treatment plan with the patient. The patient was provided an opportunity to ask questions and all were answered. The patient agreed with the plan and demonstrated an understanding of the instructions.   The patient was advised to call back or seek an in-person evaluation if the symptoms worsen or if the condition fails to improve as anticipated.  I provided 15 minutes of non-face-to-face time during this encounter.   Dan MakerAshley C Braven Wolk, NP     Assessment and Plan:  Anxiety Well managed by current regimen; continue medications; discussed could try taking buspar 10 mg in the AM if having a particularly stressful stretch, otherwise she is doing well with PRN xanax, not needing daily Stress management techniques discussed, increase water, good sleep hygiene discussed, increase exercise, and increase veggies.  She has no future appointments scheduled; she will call to schedule if needing further refills of xanax, or once she gets insurance.    Further disposition pending results of labs. Discussed med's effects and SE's.   Over 15 minutes of exam, counseling, chart review, and critical decision making was performed.   No future appointments.  ------------------------------------------------------------------------------------------------------------------  HPI 48 y.o.female presents  for 6-8 week follow up after initiation of medications for anxiety.   She has hx of anxiety, was restarted on medications after she spoke to me tearful, lost job, was without insurance due to recent relocation and under a lot of stress due to covid 19.  She also reported difficulty sleeping. She scored 11 on GAD scale, PHQ-9 of 7.   Discussed SSRIs, but per her preference initiated buspar TID with PRN xanax after reviewing PDMP.   She reports she has stopped taking buspar after about a week, didn't like taking anything daily.   Stopped watching the news and doing much better, mainly taking xanax as needed in the evening (3 out of 7 days), takes 1/2 tab of xanax during the day only if she's very anxious and starting to have chest pain, overall feels much happier. She is talking with her sister daily which is helping.   GAD-7 today is 5, PHQ-9 of 1.   Past Medical History:  Diagnosis Date  . Anxiety   . DDD (degenerative disc disease)   . GERD (gastroesophageal reflux disease)      No Known Allergies  Current Outpatient Medications on File Prior to Visit  Medication Sig  . acyclovir (ZOVIRAX) 800 MG tablet TAKE 1 TABLET (800 MG TOTAL) BY MOUTH AS NEEDED.  . busPIRone (BUSPAR) 10 MG tablet Start with 1/2 tab twice a day x 1 week; increase to three times a day x 1 week; then full tab twice daily.  . Cholecalciferol (VITAMIN D3) 2000 units TABS Take by mouth daily.  . Magnesium 250 MG TABS Take 1 tablet by mouth daily.  . Multiple Vitamins-Minerals (MULTIVITAMIN ADULT) CHEW Chew by mouth.   No current facility-administered medications on file prior to visit.  ROS: all negative except above.   Physical Exam:  BP 120/78   Pulse 79   General : Well sounding patient in no apparent distress HEENT: no hoarseness, no cough for duration of visit Lungs: speaks in complete sentences, no audible wheezing, no apparent distress Neurological: alert, oriented x 3 Psychiatric: pleasant,  judgement appropriate, non-pressured speech  Dan Maker, NP 10:13 AM [facility]

## 2018-10-31 ENCOUNTER — Encounter: Payer: Self-pay | Admitting: Adult Health

## 2018-10-31 ENCOUNTER — Other Ambulatory Visit: Payer: Self-pay

## 2018-10-31 ENCOUNTER — Ambulatory Visit: Payer: Self-pay | Admitting: Adult Health

## 2018-10-31 VITALS — BP 120/78 | HR 79

## 2018-10-31 DIAGNOSIS — F419 Anxiety disorder, unspecified: Secondary | ICD-10-CM

## 2018-10-31 MED ORDER — ALPRAZOLAM 0.5 MG PO TABS: ORAL_TABLET | ORAL | 0 refills | Status: DC

## 2018-11-14 ENCOUNTER — Telehealth: Payer: Self-pay

## 2018-11-14 DIAGNOSIS — N760 Acute vaginitis: Secondary | ICD-10-CM

## 2018-11-14 NOTE — Telephone Encounter (Signed)
Patient requesting a prescription for a yeast infection.

## 2018-11-15 MED ORDER — TRIAMCINOLONE ACETONIDE 0.1 % EX OINT: 1.0000 "application " | TOPICAL_OINTMENT | Freq: Two times a day (BID) | CUTANEOUS | 1 refills | Status: DC

## 2018-11-15 MED ORDER — FLUCONAZOLE 150 MG PO TABS: 150.0000 mg | ORAL_TABLET | Freq: Every day | ORAL | 3 refills | Status: DC

## 2018-11-15 MED ORDER — NYSTATIN 100000 UNIT/GM EX OINT: 1.0000 "application " | TOPICAL_OINTMENT | Freq: Two times a day (BID) | CUTANEOUS | 0 refills | Status: DC

## 2018-11-15 NOTE — Telephone Encounter (Signed)
THIS ENCOUNTER IS A VIRTUAL VISIT DUE TO COVID-19 - PATIENT WAS NOT SEEN IN THE OFFICE.  PATIENT HAS CONSENTED TO VIRTUAL VISIT / TELEMEDICINE VISIT   Virtual Visit via telephone Note  I connected with Brenda Wilson on 11/15/2018 11/14/2018  by telephone.  I verified that I am speaking with the correct person using two identifiers.    I discussed the limitations of evaluation and management by telemedicine and the availability of in person appointments. The patient expressed understanding and agreed to proceed.  History of Present Illness: 48 y.o. WF calls with vaginitis.  States she has history of yeast infections. Calls with itching, white discharge, no odor.  No new partners. No burning when you pee.  Denies vaginal atrophy.   Medications       Current Outpatient Medications (Other):  .  acyclovir (ZOVIRAX) 800 MG tablet, TAKE 1 TABLET (800 MG TOTAL) BY MOUTH AS NEEDED. Marland Kitchen  ALPRAZolam (XANAX) 0.5 MG tablet, Take 1/2-1 tab up to three times a day only if needed for severe anxiety or panic attacks. Do not take with alcohol or sedating medications. .  busPIRone (BUSPAR) 10 MG tablet, Start with 1/2 tab twice a day x 1 week; increase to three times a day x 1 week; then full tab twice daily. .  Cholecalciferol (VITAMIN D3) 2000 units TABS, Take by mouth daily. .  Magnesium 250 MG TABS, Take 1 tablet by mouth daily. .  Multiple Vitamins-Minerals (MULTIVITAMIN ADULT) CHEW, Chew by mouth.  Problem list She has Genetic carrier of other disease; Prediabetes; Myalgia; Anxiety; and Costochondral chest pain on their problem list.   Observations/Objective: General Appearance:Well sounding, in no apparent distress.  ENT/Mouth: No hoarseness, No cough for duration of visit.  Respiratory: completing full sentences without distress, without audible wheeze Neuro: Awake and oriented X 3,  Psych:  Insight and Judgment appropriate.   Assessment and Plan: Brenda Wilson was seen today for  vaginitis.  Diagnoses and all orders for this visit:  Acute vaginitis -     fluconazole (DIFLUCAN) 150 MG tablet; Take 1 tablet (150 mg total) by mouth daily. -     nystatin ointment (MYCOSTATIN); Apply 1 application topically 2 (two) times daily. -     triamcinolone ointment (KENALOG) 0.1 %; Apply 1 application topically 2 (two) times daily.   No charge patient self pay, states she will pay outstanding bill with the office  Follow Up Instructions: I discussed the assessment and treatment plan with the patient. The patient was provided an opportunity to ask questions and all were answered. The patient agreed with the plan and demonstrated an understanding of the instructions.   The patient was advised to call back or seek an in-person evaluation if the symptoms worsen or if the condition fails to improve as anticipated.  I provided 15 minutes of non-face-to-face time during this encounter.   Quentin Mulling, PA-C

## 2018-11-15 NOTE — Addendum Note (Signed)
Addended by: Quentin Mulling R on: 11/15/2018 09:22 AM   Modules accepted: Orders

## 2018-11-27 ENCOUNTER — Other Ambulatory Visit: Payer: Self-pay | Admitting: Adult Health

## 2018-11-27 ENCOUNTER — Other Ambulatory Visit: Payer: Self-pay

## 2018-11-27 DIAGNOSIS — F419 Anxiety disorder, unspecified: Secondary | ICD-10-CM

## 2018-11-27 MED ORDER — ALPRAZOLAM 0.5 MG PO TABS: ORAL_TABLET | ORAL | 0 refills | Status: DC

## 2018-11-27 MED ORDER — BUSPIRONE HCL 10 MG PO TABS: ORAL_TABLET | ORAL | 0 refills | Status: DC

## 2018-11-27 NOTE — Telephone Encounter (Signed)
Alprazolam refill request. Last prescription written on 10/31/18. In que for review.

## 2019-03-06 ENCOUNTER — Telehealth: Payer: Self-pay | Admitting: *Deleted

## 2019-03-06 ENCOUNTER — Other Ambulatory Visit: Payer: Self-pay | Admitting: Adult Health

## 2019-03-06 DIAGNOSIS — M545 Low back pain, unspecified: Secondary | ICD-10-CM

## 2019-03-06 MED ORDER — CYCLOBENZAPRINE HCL 10 MG PO TABS: ORAL_TABLET | ORAL | 0 refills | Status: DC

## 2019-03-06 NOTE — Telephone Encounter (Signed)
Pt scheduled CPE for Oct 22nd

## 2019-03-06 NOTE — Telephone Encounter (Signed)
Patient requested a refill for muscle relaxer, due to starting a new, physically demanding job. States she is having muscle pain. The pharmacy is [facility] in [place].

## 2019-03-25 ENCOUNTER — Other Ambulatory Visit: Payer: Self-pay

## 2019-03-25 ENCOUNTER — Ambulatory Visit (INDEPENDENT_AMBULATORY_CARE_PROVIDER_SITE_OTHER): Payer: BC Managed Care – PPO | Admitting: Adult Health Nurse Practitioner

## 2019-03-25 ENCOUNTER — Encounter: Payer: Self-pay | Admitting: Adult Health Nurse Practitioner

## 2019-03-25 VITALS — BP 122/84 | HR 100 | Temp 98.4°F | Wt 144.0 lb

## 2019-03-25 DIAGNOSIS — M6289 Other specified disorders of muscle: Secondary | ICD-10-CM

## 2019-03-25 DIAGNOSIS — M62838 Other muscle spasm: Secondary | ICD-10-CM | POA: Diagnosis not present

## 2019-03-25 DIAGNOSIS — R531 Weakness: Secondary | ICD-10-CM

## 2019-03-25 DIAGNOSIS — M791 Myalgia, unspecified site: Secondary | ICD-10-CM

## 2019-03-25 MED ORDER — DIAZEPAM 5 MG PO TABS: ORAL_TABLET | ORAL | 0 refills | Status: DC

## 2019-03-25 MED ORDER — PREDNISONE 10 MG (21) PO TBPK: ORAL_TABLET | Freq: Every day | ORAL | 0 refills | Status: DC

## 2019-03-25 NOTE — Addendum Note (Signed)
Addended byGarnet Sierras A on: 03/25/2019 02:19 PM   Modules accepted: Orders

## 2019-03-25 NOTE — Progress Notes (Addendum)
Assessment and Plan: Brenda Wilson was seen today for acute visit and muscle pain.  Diagnoses and all orders for this visit:  Muscle stiffness Cervical paraspinous muscle spasms -     CBC with Differential/Platelet -     COMPLETE METABOLIC PANEL WITH GFR -     Magnesium -     VITAMIN D 25 Hydroxy (Vit-D Deficiency, Fractures) -     Sedimentation rate -     ANA -     C-reactive protein -     Rheumatoid factor -     Rocky mtn spotted fvr abs pnl(IgG+IgM) -     Anti-Steadham antibody -     Myasthenia gravis panel 2 -     B. burgdorfi antibodies -     diazepam (VALIUM) 5 MG tablet; Take one tablet by mouth every evening, as needed, for muscle spasms.  Weakness generalized -     C-reactive protein -     Rheumatoid factor -     Rocky mtn spotted fvr abs pnl(IgG+IgM) -     Anti-Feuerborn antibody -     Myasthenia gravis panel 2 -     B. burgdorfi antibodies -     predniSONE (STERAPRED UNI-PAK 21 TAB) 10 MG (21) TBPK tablet; Take by mouth daily. Follow directions on Taper pack  Myalgia -     Sedimentation rate -     ANA -     C-reactive protein -     Rheumatoid factor -     Rocky mtn spotted fvr abs pnl(IgG+IgM) -     Anti-Nation antibody -     Myasthenia gravis panel 2 -     B. burgdorfi antibodies -     predniSONE (STERAPRED UNI-PAK 21 TAB) 10 MG (21) TBPK tablet; Take by mouth daily. Follow directions on Taper pack  Follow up in two weeks on myalgias.  Discussed stretching and application of heat/ice.  Further treatment based on labs results and follow up. Discussed med's effects and SE's.   Over 30 minutes of interview, exam, counseling, chart review, and critical decision making was performed.   Future Appointments  Date Time Provider [place]  04/10/2019  9:00 AM Liane Comber, NP [facility] None    ------------------------------------------------------------------------------------------------------------------   HPI 48 y.o.female presents for evaluation of myalgias.     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  She reports that she is having myalgias all over her body.  She reports that her left arm is very painful.  Bother of her arms hurt so bad that she can not use her arms to get up.  She reports pain with deep breath.    When she is up moving around it is better.  When she gets home she rest and that makes it works or ":flares up" and it is almost impossible to sleep at night.  She has taken flexeril and this has had not effect on her.  She also tried diclofenac BID and that seemed to help some for one day then it returned.  She did also try 800mg  of ibuprofen every 4 hours and that did not help her pain.  She is concerned because this has been a slow progressive increase in myalgias Past Medical History:  Diagnosis Date  . Anxiety   . DDD (degenerative disc disease)   . GERD (gastroesophageal reflux disease)      No Known Allergies  Current  Outpatient Medications on File Prior to Visit  Medication Sig  . acyclovir (ZOVIRAX) 800 MG tablet TAKE 1 TABLET (800 MG TOTAL) BY MOUTH AS NEEDED.  Marland Kitchen ALPRAZolam (XANAX) 0.5 MG tablet Take 1/2-1 tab up to three times a day ONLY AS NEEDED for severe anxiety or panic attacks.  . busPIRone (BUSPAR) 10 MG tablet Take 1-2 tabs three times daily by mouth for anxiety.  . Cholecalciferol (VITAMIN D3) 2000 units TABS Take by mouth daily.  . Magnesium 250 MG TABS Take 1 tablet by mouth daily.  . Multiple Vitamins-Minerals (MULTIVITAMIN ADULT) CHEW Chew by mouth.   No current facility-administered medications on file prior to visit.     ROS: Review of Systems  Constitutional: Positive for malaise/fatigue. Negative for chills, diaphoresis, fever and weight loss.  HENT: Negative for congestion, ear discharge, ear pain, hearing loss, nosebleeds, sinus pain, sore throat and tinnitus.   Eyes: Negative for blurred vision, double vision,  photophobia, pain, discharge and redness.  Respiratory: Negative for cough, hemoptysis, sputum production, shortness of breath, wheezing and stridor.   Cardiovascular: Negative for chest pain, palpitations, orthopnea, claudication, leg swelling and PND.  Gastrointestinal: Negative for abdominal pain, blood in stool, constipation, diarrhea, heartburn, melena, nausea and vomiting.  Genitourinary: Negative for dysuria, flank pain, frequency, hematuria and urgency.  Musculoskeletal: Positive for myalgias and neck pain. Negative for back pain, falls and joint pain.  Skin: Negative for itching and rash.  Neurological: Positive for weakness. Negative for dizziness, tingling, tremors, sensory change, speech change, focal weakness, seizures, loss of consciousness and headaches.  Endo/Heme/Allergies: Positive for environmental allergies and polydipsia. Bruises/bleeds easily.     Physical Exam:  BP 122/84   Pulse 100   Temp 98.4 F (36.9 C)   Wt 144 lb (65.3 kg)   SpO2 97%   BMI 24.34 kg/m   General Appearance: Well nourished, in no apparent distress. Eyes: PERRLA, EOMs, conjunctiva no swelling or erythema Sinuses: No Frontal/maxillary tenderness ENT/Mouth: Ext aud canals clear, TMs without erythema, bulging. No erythema, swelling, or exudate on post pharynx.  Tonsils not swollen or erythematous. Hearing normal.  Neck: Supple, thyroid normal.  Respiratory: Respiratory effort normal, BS equal bilaterally without rales, rhonchi, wheezing or stridor.  Cardio: RRR with no MRGs. Brisk peripheral pulses without edema.  Abdomen: Soft, + BS.  Non tender, no guarding, rebound, hernias, masses. Lymphatics: Non tender without lymphadenopathy.  Musculoskeletal: Full ROM, 4/5 strength upper extremities 5/5 lower extremities, antalgic gait, improves after several steps.  Superficial tenderness of right arm and cervical spine palpation. Skin: Warm, dry without rashes, lesions, ecchymosis.  Neuro: Cranial  nerves intact. Normal muscle tone, no cerebellar symptoms. Sensation intact upper and lower. Psych: Awake and oriented X 3, normal affect, Insight and Judgment appropriate.     Elder Negus, NP 11:23 AM [facility]

## 2019-03-25 NOTE — Patient Instructions (Addendum)
We will respond via MyCart with lab results.   We are going to sent prednisone to your pharmacy.  Take this food.  Monitor your symptoms.  Increase water intake, 80oz or more a day.    Muscle Pain, Adult Muscle pain (myalgia) may be mild or severe. In most cases, the pain lasts only a short time and it goes away without treatment. It is normal to feel some muscle pain after starting a workout program. Muscles that have not been used often will be sore at first. Muscle pain may also be caused by many other things, including:  Overuse or muscle strain, especially if you are not in shape. This is the most common cause of muscle pain.  Injury.  Bruises.  Viruses, such as the flu.  Infectious diseases.  A chronic condition that causes muscle tenderness, fatigue, and headache (fibromyalgia).  A condition, such as lupus, in which the body's disease-fighting system attacks other organs in the body (autoimmune or rheumatologic diseases).  Certain drugs, including ACE inhibitors and statins. To diagnose the cause of your muscle pain, your health care provider will do a physical exam and ask questions about the pain and when it began. If you have not had muscle pain for very long, your health care provider may want to wait before doing much testing. If your muscle pain has lasted a long time, your health care provider may want to run tests right away. In some cases, this may include tests to rule out certain conditions or illnesses. Treatment for muscle pain depends on the cause. Home care is often enough to relieve muscle pain. Your health care provider may also prescribe anti-inflammatory medicine. Follow these instructions at home: Activity  If overuse is causing your muscle pain: ? Slow down your activities until the pain goes away. ? Do regular, gentle exercises if you are not usually active. ? Warm up before exercising. Stretch before and after exercising. This can help lower the  risk of muscle pain.  Do not continue working out if the pain is very bad. Bad pain could mean that you have injured a muscle. Managing pain and discomfort   If directed, apply ice to the sore muscle: ? Put ice in a plastic bag. ? Place a towel between your skin and the bag. ? Leave the ice on for 20 minutes, 2-3 times a day.  You may also alternate between applying ice and applying heat as told by your health care provider. To apply heat, use the heat source that your health care provider recommends, such as a moist heat pack or a heating pad. ? Place a towel between your skin and the heat source. ? Leave the heat on for 20-30 minutes. ? Remove the heat if your skin turns bright red. This is especially important if you are unable to feel pain, heat, or cold. You may have a greater risk of getting burned. Medicines  Take over-the-counter and prescription medicines only as told by your health care provider.  Do not drive or use heavy machinery while taking prescription pain medicine. Contact a health care provider if:  Your muscle pain gets worse and medicines do not help.  You have muscle pain that lasts longer than 3 days.  You have a rash or fever along with muscle pain.  You have muscle pain after a tick bite.  You have muscle pain while working out, even though you are in good physical condition.  You have redness, soreness, or swelling along  with muscle pain.  You have muscle pain after starting a new medicine or changing the dose of a medicine. Get help right away if:  You have trouble breathing.  You have trouble swallowing.  You have muscle pain along with a stiff neck, fever, and vomiting.  You have severe muscle weakness or cannot move part of your body. This information is not intended to replace advice given to you by your health care provider. Make sure you discuss any questions you have with your health care provider. Document Released: 04/27/2006 Document  Revised: 05/18/2017 Document Reviewed: 10/26/2015 Elsevier Patient Education  2020 [employer].

## 2019-03-27 ENCOUNTER — Other Ambulatory Visit: Payer: Self-pay | Admitting: Adult Health Nurse Practitioner

## 2019-03-27 DIAGNOSIS — E559 Vitamin D deficiency, unspecified: Secondary | ICD-10-CM

## 2019-03-27 MED ORDER — CHOLECALCIFEROL 1.25 MG (50000 UT) PO CAPS: ORAL_CAPSULE | ORAL | 0 refills | Status: DC

## 2019-03-29 LAB — CBC WITH DIFFERENTIAL/PLATELET
Absolute Monocytes: 330 cells/uL (ref 200–950)
Basophils Absolute: 39 cells/uL (ref 0–200)
Basophils Relative: 0.7 %
Eosinophils Absolute: 61 cells/uL (ref 15–500)
Eosinophils Relative: 1.1 %
HCT: 38.6 % (ref 35.0–45.0)
Hemoglobin: 12.8 g/dL (ref 11.7–15.5)
Lymphs Abs: 1298 cells/uL (ref 850–3900)
MCH: 28.2 pg (ref 27.0–33.0)
MCHC: 33.2 g/dL (ref 32.0–36.0)
MCV: 85 fL (ref 80.0–100.0)
MPV: 9.5 fL (ref 7.5–12.5)
Monocytes Relative: 6 %
Neutro Abs: 3773 cells/uL (ref 1500–7800)
Neutrophils Relative %: 68.6 %
Platelets: 407 10*3/uL — ABNORMAL HIGH (ref 140–400)
RBC: 4.54 10*6/uL (ref 3.80–5.10)
RDW: 12.4 % (ref 11.0–15.0)
Total Lymphocyte: 23.6 %
WBC: 5.5 10*3/uL (ref 3.8–10.8)

## 2019-03-29 LAB — VITAMIN D 25 HYDROXY (VIT D DEFICIENCY, FRACTURES): Vit D, 25-Hydroxy: 26 ng/mL — ABNORMAL LOW (ref 30–100)

## 2019-03-29 LAB — ROCKY MTN SPOTTED FVR ABS PNL(IGG+IGM)
RMSF IgG: NOT DETECTED
RMSF IgM: NOT DETECTED

## 2019-03-29 LAB — B. BURGDORFI ANTIBODIES: B burgdorferi Ab IgG+IgM: <0.9 index

## 2019-03-29 LAB — COMPLETE METABOLIC PANEL WITH GFR
AG Ratio: 1.4 (calc) (ref 1.0–2.5)
ALT: 69 U/L — ABNORMAL HIGH (ref 6–29)
AST: 24 U/L (ref 10–35)
Albumin: 4.4 g/dL (ref 3.6–5.1)
Alkaline phosphatase (APISO): 314 U/L — ABNORMAL HIGH (ref 37–153)
BUN: 11 mg/dL (ref 7–25)
CO2: 29 mmol/L (ref 20–32)
Calcium: 9.9 mg/dL (ref 8.6–10.4)
Chloride: 103 mmol/L (ref 98–110)
Creat: 0.65 mg/dL (ref 0.50–1.05)
GFR, Est African American: 120 mL/min/{1.73_m2} (ref >=60)
GFR, Est Non African American: 104 mL/min/{1.73_m2} (ref >=60)
Globulin: 3.2 g/dL (calc) (ref 1.9–3.7)
Glucose, Bld: 104 mg/dL — ABNORMAL HIGH (ref 65–99)
Potassium: 4.2 mmol/L (ref 3.5–5.3)
Sodium: 139 mmol/L (ref 135–146)
Total Bilirubin: 0.4 mg/dL (ref 0.2–1.2)
Total Protein: 7.6 g/dL (ref 6.1–8.1)

## 2019-03-29 LAB — MYASTHENIA GRAVIS PANEL 2
A CHR BINDING ABS: <0.3 nmol/L
ACHR Blocking Abs: <15 % Inhibition (ref <15)
Acetylchol Modul Ab: 7 % Inhibition

## 2019-03-29 LAB — SEDIMENTATION RATE: Sed Rate: 58 mm/h — ABNORMAL HIGH (ref 0–20)

## 2019-03-29 LAB — C-REACTIVE PROTEIN: CRP: 4.3 mg/L (ref <8.0)

## 2019-03-29 LAB — ANA: Anti Nuclear Antibody (ANA): NEGATIVE

## 2019-03-29 LAB — MAGNESIUM: Magnesium: 2.1 mg/dL (ref 1.5–2.5)

## 2019-03-29 LAB — RHEUMATOID FACTOR: Rheumatoid fact SerPl-aCnc: <14 IU/mL (ref <14)

## 2019-03-29 LAB — ANTI-SMITH ANTIBODY: ENA SM Ab Ser-aCnc: <1 AI

## 2019-03-31 ENCOUNTER — Encounter: Payer: Self-pay | Admitting: Adult Health

## 2019-04-01 ENCOUNTER — Telehealth: Payer: Self-pay

## 2019-04-01 ENCOUNTER — Other Ambulatory Visit: Payer: Self-pay | Admitting: Adult Health Nurse Practitioner

## 2019-04-01 DIAGNOSIS — R29898 Other symptoms and signs involving the musculoskeletal system: Secondary | ICD-10-CM

## 2019-04-01 DIAGNOSIS — E71529 X-linked adrenoleukodystrophy, unspecified type: Secondary | ICD-10-CM

## 2019-04-01 MED ORDER — PREDNISONE 20 MG PO TABS: ORAL_TABLET | ORAL | 1 refills | Status: DC

## 2019-04-01 NOTE — Telephone Encounter (Signed)
Patient states that she has finished the Prednisone and she in pain again. Please advise.

## 2019-04-01 NOTE — Progress Notes (Signed)
Patient experiencing progressive muscle rigidity from superior caudal to mid abdomen.  She also has accompanied weakness of bilateral upper extremities and impaired fine motor.  Throughout workup for other etiologies.  Prednisone taper prescribed with immediate improvement of symptoms.  They have returned once she started taking 10mg  of presdnisone.  Will send in prednisone 20mg  every other day to management until further evaluation. She followed with a neurologist in the past for ALD but has not established here in [place].  She denies any flares like this in the past.  She has a sister who has had multiple issues with her muscles but they are not in contact.  Will place referral for Neurologist.  Her daughter follows with Dr Gaynell Face and patient reports he would take her as patient.  Will send secondary referral if unable to take patient for ALD management.   Brenda Sierras, NP [facility] 04/01/2019  1:24 PM

## 2019-04-04 ENCOUNTER — Telehealth: Payer: Self-pay | Admitting: *Deleted

## 2019-04-04 ENCOUNTER — Telehealth: Payer: Self-pay | Admitting: Physician Assistant

## 2019-04-04 MED ORDER — GABAPENTIN 300 MG PO CAPS: 300.0000 mg | ORAL_CAPSULE | Freq: Three times a day (TID) | ORAL | 2 refills | Status: DC

## 2019-04-04 NOTE — Telephone Encounter (Signed)
Will give the patient gabapentin for pain to try with the prednisone.

## 2019-04-04 NOTE — Telephone Encounter (Signed)
Patient calling about an RX for pain medication. She states she is taking Prednisone every day and continues to have neck, arms and upper chest pain.   She is waiting to hear from the neurologist.

## 2019-04-08 ENCOUNTER — Telehealth: Payer: Self-pay

## 2019-04-08 ENCOUNTER — Other Ambulatory Visit: Payer: Self-pay

## 2019-04-08 DIAGNOSIS — E71529 X-linked adrenoleukodystrophy, unspecified type: Secondary | ICD-10-CM

## 2019-04-08 MED ORDER — PREDNISONE 20 MG PO TABS: ORAL_TABLET | ORAL | 1 refills | Status: AC

## 2019-04-08 NOTE — Telephone Encounter (Signed)
Prednisone refill has been sent to her pharmacy.

## 2019-04-08 NOTE — Telephone Encounter (Signed)
-----   Message from Garnet Sierras, NP sent at 04/08/2019  1:50 PM EDT ----- Regarding: RE: QUESTION Contact: [phone] Yes may take one tablet every day.  Also responded to her.  Asked her to let us know if she needs refill.  -Danton Sewer ----- Message ----- From: Elenor Quinones, CMA Sent: 04/08/2019  11:11 AM EDT To: Garnet Sierras, NP Subject: QUESTION                                       PAIN IN LEGS   Can not move legs  Pt would like to know HOW she should be taking her PREDNISONE, can she take it every day.   Please advise & thank you.

## 2019-04-09 ENCOUNTER — Other Ambulatory Visit: Payer: Self-pay | Admitting: Adult Health Nurse Practitioner

## 2019-04-09 DIAGNOSIS — E71529 X-linked adrenoleukodystrophy, unspecified type: Secondary | ICD-10-CM

## 2019-04-09 MED ORDER — PREDNISONE 20 MG PO TABS: ORAL_TABLET | ORAL | 0 refills | Status: AC

## 2019-04-10 ENCOUNTER — Encounter: Payer: Self-pay | Admitting: Adult Health

## 2019-06-16 ENCOUNTER — Encounter: Payer: Self-pay | Admitting: Neurology

## 2019-06-16 ENCOUNTER — Ambulatory Visit (INDEPENDENT_AMBULATORY_CARE_PROVIDER_SITE_OTHER): Payer: BC Managed Care – PPO | Admitting: Neurology

## 2019-06-16 ENCOUNTER — Other Ambulatory Visit: Payer: Self-pay

## 2019-06-16 VITALS — BP 125/79 | HR 75 | Temp 97.7°F | Ht 64.0 in | Wt 149.5 lb

## 2019-06-16 DIAGNOSIS — R258 Other abnormal involuntary movements: Secondary | ICD-10-CM | POA: Diagnosis not present

## 2019-06-16 DIAGNOSIS — R200 Anesthesia of skin: Secondary | ICD-10-CM | POA: Diagnosis not present

## 2019-06-16 DIAGNOSIS — E71529 X-linked adrenoleukodystrophy, unspecified type: Secondary | ICD-10-CM

## 2019-06-16 DIAGNOSIS — M791 Myalgia, unspecified site: Secondary | ICD-10-CM | POA: Diagnosis not present

## 2019-06-16 DIAGNOSIS — R292 Abnormal reflex: Secondary | ICD-10-CM | POA: Diagnosis not present

## 2019-06-16 MED ORDER — BACLOFEN 10 MG PO TABS: 10.0000 mg | ORAL_TABLET | Freq: Three times a day (TID) | ORAL | 0 refills | Status: DC | PRN

## 2019-06-16 NOTE — Patient Instructions (Signed)
Baclofen 1/2 - 1 tab 3x a day. Stop flexeril. EMG/NCS, MRI Brain and cervical spine Physical Therapy (Strentching, dry needling) Blood work today  Baclofen tablets What is this medicine? BACLOFEN (BAK loe fen) helps relieve spasms and cramping of muscles. It may be used to treat symptoms of multiple sclerosis or spinal cord injury. This medicine may be used for other purposes; ask your health care provider or pharmacist if you have questions. COMMON BRAND NAME(S): ED Baclofen, Lioresal What should I tell my health care provider before I take this medicine? They need to know if you have any of these conditions:  kidney disease  seizures  stroke  an unusual or allergic reaction to baclofen, other medicines, foods, dyes, or preservatives  pregnant or trying to get pregnant  breast-feeding How should I use this medicine? Take this medicine by mouth. Swallow it with a drink of water. Follow the directions on the prescription label. Do not take more medicine than you are told to take. Talk to your pediatrician regarding the use of this medicine in children. Special care may be needed. Overdosage: If you think you have taken too much of this medicine contact a poison control center or emergency room at once. NOTE: This medicine is only for you. Do not share this medicine with others. What if I miss a dose? If you miss a dose, take it as soon as you can. If it is almost time for your next dose, take only that dose. Do not take double or extra doses. What may interact with this medicine? Do not take this medication with any of the following medicines:  narcotic medicines for cough This medicine may also interact with the following medications:  alcohol  antihistamines for allergy, cough and cold  certain medicines for anxiety or sleep  certain medicines for depression like amitriptyline, fluoxetine, sertraline  certain medicines for seizures like phenobarbital,  primidone  general anesthetics like halothane, isoflurane, methoxyflurane, propofol  local anesthetics like lidocaine, pramoxine, tetracaine  medicines that relax muscles for surgery  narcotic medicines for pain  phenothiazines like chlorpromazine, mesoridazine, prochlorperazine, thioridazine This list may not describe all possible interactions. Give your health care provider a list of all the medicines, herbs, non-prescription drugs, or dietary supplements you use. Also tell them if you smoke, drink alcohol, or use illegal drugs. Some items may interact with your medicine. What should I watch for while using this medicine? Tell your doctor or health care professional if your symptoms do not start to get better or if they get worse. Do not suddenly stop taking your medicine. If you do, you may develop a severe reaction. If your doctor wants you to stop the medicine, the dose will be slowly lowered over time to avoid any side effects. Follow the advice of your doctor. You may get drowsy or dizzy. Do not drive, use machinery, or do anything that needs mental alertness until you know how this medicine affects you. Do not stand or sit up quickly, especially if you are an older patient. This reduces the risk of dizzy or fainting spells. Alcohol may interfere with the effect of this medicine. Avoid alcoholic drinks. If you are taking another medicine that also causes drowsiness, you may have more side effects. Give your health care provider a list of all medicines you use. Your doctor will tell you how much medicine to take. Do not take more medicine than directed. Call emergency for help if you have problems breathing or unusual sleepiness. What  side effects may I notice from receiving this medicine? Side effects that you should report to your doctor or health care professional as soon as possible:  allergic reactions like skin rash, itching or hives, swelling of the face, lips, or tongue  breathing  problems  changes in emotions or moods  changes in vision  chest pain  fast, irregular heartbeat  feeling faint or lightheaded, falls  hallucinations  loss of balance or coordination  ringing of the ears  seizures  trouble passing urine or change in the amount of urine  trouble walking  unusually weak or tired Side effects that usually do not require medical attention (report to your doctor or health care professional if they continue or are bothersome):  changes in taste  confusion  constipation  diarrhea  dry mouth  headache  muscle weakness  nausea, vomiting  trouble sleeping This list may not describe all possible side effects. Call your doctor for medical advice about side effects. You may report side effects to FDA at 1-800-FDA-1088. Where should I keep my medicine? Keep out of the reach of children. Store at room temperature between 15 and 30 degrees C (59 and 86 degrees F). Keep container tightly closed. Throw away any unused medicine after the expiration date. NOTE: This sheet is a summary. It may not cover all possible information. If you have questions about this medicine, talk to your doctor, pharmacist, or health care provider.  2020 Elsevier/Gold Standard (2017-03-17 09:56:42)  Electromyoneurogram Electromyoneurogram is a test to check how well your muscles and nerves are working. This procedure includes the combined use of electromyogram (EMG) and nerve conduction study (NCS). EMG is used to look for muscular disorders. NCS, which is also called electroneurogram, measures how well your nerves are controlling your muscles. The procedures are usually done together to check if your muscles and nerves are healthy. If the results of the tests are abnormal, this may indicate disease or injury, such as a neuromuscular disease or peripheral nerve damage. Tell a health care provider about:  Any allergies you have.  All medicines you are taking,  including vitamins, herbs, eye drops, creams, and over-the-counter medicines.  Any problems you or family members have had with anesthetic medicines.  Any blood disorders you have.  Any surgeries you have had.  Any medical conditions you have.  If you have a pacemaker.  Whether you are pregnant or may be pregnant. What are the risks? Generally, this is a safe procedure. However, problems may occur, including:  Infection where the electrodes were inserted.  Bleeding. What happens before the procedure? Medicines Ask your health care provider about:  Changing or stopping your regular medicines. This is especially important if you are taking diabetes medicines or blood thinners.  Taking medicines such as aspirin and ibuprofen. These medicines can thin your blood. Do not take these medicines unless your health care provider tells you to take them.  Taking over-the-counter medicines, vitamins, herbs, and supplements. General instructions  Your health care provider may ask you to avoid: ? Beverages that have caffeine, such as coffee and tea. ? Any products that contain nicotine or tobacco. These products include cigarettes, e-cigarettes, and chewing tobacco. If you need help quitting, ask your health care provider.  Do not use lotions or creams on the same day that you will be having the procedure. What happens during the procedure? For EMG   Your health care provider will ask you to stay in a position so that he or  she can access the muscle that will be studied. You may be standing, sitting, or lying down.  You may be given a medicine that numbs the area (local anesthetic).  A very thin needle that has an electrode will be inserted into your muscle.  Another small electrode will be placed on your skin near the muscle.  Your health care provider will ask you to continue to remain still.  The electrodes will send a signal that tells about the electrical activity of your  muscles. You may see this on a monitor or hear it in the room.  After your muscles have been studied at rest, your health care provider will ask you to contract or flex your muscles. The electrodes will send a signal that tells about the electrical activity of your muscles.  Your health care provider will remove the electrodes and the electrode needles when the procedure is finished. The procedure may vary among health care providers and hospitals. For NCS   An electrode that records your nerve activity (recording electrode) will be placed on your skin by the muscle that is being studied.  An electrode that is used as a reference (reference electrode) will be placed near the recording electrode.  A paste or gel will be applied to your skin between the recording electrode and the reference electrode.  Your nerve will be stimulated with a mild shock. Your health care provider will measure how much time it takes for your muscle to react.  Your health care provider will remove the electrodes and the gel when the procedure is finished. The procedure may vary among health care providers and hospitals. What happens after the procedure?  It is up to you to get the results of your procedure. Ask your health care provider, or the department that is doing the procedure, when your results will be ready.  Your health care provider may: ? Give you medicines for any pain. ? Monitor the insertion sites to make sure that bleeding stops. Summary  Electromyoneurogram is a test to check how well your muscles and nerves are working.  If the results of the tests are abnormal, this may indicate disease or injury.  This is a safe procedure. However, problems may occur, such as bleeding and infection.  Your health care provider will do two tests to complete this procedure. One checks your muscles (EMG) and another checks your nerves (NCS).  It is up to you to get the results of your procedure. Ask your  health care provider, or the department that is doing the procedure, when your results will be ready. This information is not intended to replace advice given to you by your health care provider. Make sure you discuss any questions you have with your health care provider. Document Released: 10/06/2004 Document Revised: 02/19/2018 Document Reviewed: 02/01/2018 Elsevier Patient Education  2020 [employer].

## 2019-06-16 NOTE — Progress Notes (Signed)
[facility]    Provider:  Dr Jaynee Eagles Requesting Provider: Unk Pinto, MD Primary Care Provider:  Unk Pinto, MD  CC:  Muscle pain  HPI:  Brenda Wilson is a 48 y.o. female here as requested by Unk Pinto, MD for ALD. 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 She has muscle pain. She was out of work for 6 months due to a move here in march and she started working in August. She gets very sore, a lot of muscle pain, she has tightness in her neck, arms, legs, back. Every muscle hurts all over her body. She had muscle aches in the past but started worsening. She is on prednisone which helps. She has a lot of muscle pain in the chest. She has lost 2 nephews to ALD. She has a lot of tightness in her neck, her chest when she breathes, especially in the morning, it is "crippling" when sh emoves in bed. Everything hurts. She has tightness in the calves. Being on the prednisone helps. In the morning is the worst, in the neck is the worst. She has soreness in her chest. She doesn't know if she has weakness. Tried prednisone and gabapentin. Tried muscle relaxer flexeril. She has had extensive evaluation. She has numbness in the toes as well.   Reviewed notes, labs and imaging from outside physicians, which showed:  Myasthenia gravis, lyme, ANA, rf, crp, sed rate neg  XR cervical spine reviewed report from 2002: HISTORY:        [place]. COMPLETE CERVICAL SPINE - 01/15/01: THERE IS MINIMAL REVERSAL OF THE NORMAL CERVICAL LORDOSIS.  ALSO NOTED IS MINIMAL ANTERIOR SPUR FORMATION AT THE C4-5 LEVEL.  NO PREVERTEBRAL SOFT TISSUE SWELLING, FRACTURES, OR SUBLUXATIONS ARE SEEN. IMPRESSION 1.    MINIMAL REVERSAL OF THE NORMAL CERVICAL LORDOSIS. 2.    MINIMAL ANTERIOR SPONDYLOSIS AT THE C4-5 LEVEL. 3.    NO FRACTURE OR SUBLUXATION.  Review of Systems: Patient complains of symptoms per HPI as well as the following symptoms: muscle pain.  Pertinent negatives and positives per HPI. All others negative.   Social History   Socioeconomic History  . Marital status: Married    Spouse name: Dalene Seltzer  . Number of children: 1  . Years of education: 44  . Highest education level: Not on file  Occupational History  . Occupation: [employer]  Tobacco Use  . Smoking status: Never Smoker  . Smokeless tobacco: Never Used  Substance and Sexual Activity  . Alcohol use: Yes    Comment: Sometimes  . Drug use: No  . Sexual activity: Not on file  Other Topics Concern  . Not on file  Social History Narrative   Right handed   Lives with husband Dalene Seltzer.    Caffeine use: daily (1 cup every morning)   Social Determinants of Health   Financial Resource Strain:   . Difficulty of Paying Living Expenses: Not on file  Food Insecurity:   . Worried About Charity fundraiser in the Last Year: Not on file  . Ran Out of Food in the Last Year: Not on file  Transportation Needs:   . Lack of Transportation (Medical): Not on file  . Lack of Transportation (Non-Medical): Not on file  Physical Activity:   . Days of Exercise per Week: Not on file  . Minutes of Exercise per Session: Not on file  Stress:   . Feeling of Stress : Not on file  Social Connections:   . Frequency of Communication with Friends and Family: Not on file  . Frequency of Social Gatherings with Friends and Family: Not on file  . Attends Religious Services: Not on file  . Active Member of Clubs or Organizations: Not on file  . Attends BankerClub or Organization Meetings: Not on file  . Marital Status: Not on file  Intimate Partner Violence:   . Fear of Current or Ex-Partner: Not on file  . Emotionally Abused: Not on file  . Physically Abused: Not on file  . Sexually Abused: Not on file    Family History  Problem Relation Age of Onset  . Cancer Father   . Melanoma Sister   . Cancer Paternal Uncle   . Melanoma Paternal Uncle   . Cancer Paternal Grandmother     Past  Medical History:  Diagnosis Date  . Anxiety   . DDD (degenerative disc disease)   . GERD (gastroesophageal reflux disease)     Patient Active Problem List   Diagnosis Date Noted  . Anxiety 09/19/2018  . Costochondral chest pain 09/19/2018  . Genetic carrier of other disease 09/21/2017  . Prediabetes 09/21/2017  . Myalgia 09/21/2017    Past Surgical History:  Procedure Laterality Date  . SPINE SURGERY  2007   L4-L5  . TUBAL LIGATION      Current Outpatient Medications  Medication Sig Dispense Refill  . acyclovir (ZOVIRAX) 800 MG tablet TAKE 1 TABLET (800 MG TOTAL) BY MOUTH AS NEEDED. 90 tablet 3  . gabapentin (NEURONTIN) 300 MG capsule Take 1 capsule (300 mg total) by mouth 3 (three) times daily. 90 capsule 2  . Magnesium 250 MG TABS Take 1 tablet by mouth daily.    . Multiple Vitamins-Minerals (MULTIVITAMIN ADULT) CHEW Chew by mouth.    . predniSONE (DELTASONE) 20 MG tablet Take 20 mg by mouth daily with breakfast.    . baclofen (LIORESAL) 10 MG tablet Take 1 tablet (10 mg total) by mouth 3 (three) times daily as needed for muscle spasms. 30 each 0   No current facility-administered medications for this visit.    Allergies as of 06/16/2019  . (No Known Allergies)    Vitals: BP 125/79 (BP Location: Right Arm, Patient Position: Sitting, Cuff Size: Normal)   Pulse 75   Temp 97.7 F (36.5 C)   Ht 5\' 4"  (1.626 m)   Wt 149 lb 8 oz (67.8 kg)   BMI 25.66 kg/m  Last Weight:  Wt Readings from Last 1 Encounters:  06/16/19 149 lb 8 oz (67.8 kg)   Last Height:   Ht Readings from Last 1 Encounters:  06/16/19 5\' 4"  (1.626 m)     Physical exam: Exam: Gen: NAD, conversant, well nourised, obese, well groomed                     CV: RRR, no MRG. No Carotid Bruits. No peripheral edema, warm, nontender Eyes: Conjunctivae clear without exudates or hemorrhage  Neuro: Detailed Neurologic Exam  Speech:    Speech is normal; fluent and spontaneous with normal comprehension.    Cognition:    The patient is oriented to person, place, and time;     recent and remote memory intact;     language fluent;     normal attention, concentration,     fund of knowledge Cranial Nerves:    The pupils are equal, round, and reactive to light. The fundi are normal and spontaneous venous pulsations are present.  Visual fields are full to finger confrontation. Extraocular movements are intact. Trigeminal sensation is intact and the muscles of mastication are normal. The face is symmetric. The palate elevates in the midline. Hearing intact. Voice is normal. Shoulder shrug is normal. The tongue has normal motion without fasciculations.   Coordination:    Normal finger to nose and heel to shin. Normal rapid alternating movements.   Gait:    Heel-toe and tandem gait are normal.   Motor Observation:    No asymmetry, no atrophy, and no involuntary movements noted. Tone:    Normal muscle tone.    Posture:    Posture is normal. normal erect    Strength: prox LE weakness 5-/5 otherwise strength is V/V in the upper and lower limbs.      Sensation: intact to LT     Reflex Exam:  DTR's:    Deep tendon reflexes in the upper and lower extremities are 3+ bilaterally.   Toes:    The toes are downgoing bilaterally.   Clonus:    7 beats clonus at AJs.     Assessment/Plan:  48 year old patient with Adrenoleukodystrophy(ALD). Her exam is abnormal with hyperreflexia and clonus and +Hoffman's sign. Localizes to brain or cervical spine. ALD carriers can have demyelination the brain and cervical spine, need to look for lesions given concerning symptoms.   Physical Therapy: Stretching and dry needling. [facility].  She has abnormal refflexes and clonus she needs thourogh evaluation.  Neuropathy can also be associated with ALD: emg/ncs of one arm and one leg to eval for neuropathy or muscle disease Baclofen prn (discussed sedation, do not drive)  Orders Placed This Encounter   Procedures  . MR BRAIN W WO CONTRAST  . MR CERVICAL SPINE W WO CONTRAST  . Basic Metabolic Panel  . B12 and Folate Panel  . Methylmalonic Acid, Serum  . Ambulatory referral to Physical Therapy  . NCV with EMG(electromyography)   Meds ordered this encounter  Medications  . baclofen (LIORESAL) 10 MG tablet    Sig: Take 1 tablet (10 mg total) by mouth 3 (three) times daily as needed for muscle spasms.    Dispense:  30 each    Refill:  0    Cc: Lucky Cowboy, MD,  Lucky Cowboy, MD  Naomie Dean, MD  [facility] [address] [place], [place] [address]  Phone [phone] Fax [phone]

## 2019-06-19 LAB — BASIC METABOLIC PANEL
BUN/Creatinine Ratio: 12 (ref 9–23)
BUN: 10 mg/dL (ref 6–24)
CO2: 26 mmol/L (ref 20–29)
Calcium: 9.8 mg/dL (ref 8.7–10.2)
Chloride: 102 mmol/L (ref 96–106)
Creatinine, Ser: 0.85 mg/dL (ref 0.57–1.00)
GFR calc Af Amer: 92 mL/min/{1.73_m2} (ref >59)
GFR calc non Af Amer: 80 mL/min/{1.73_m2} (ref >59)
Glucose: 105 mg/dL — ABNORMAL HIGH (ref 65–99)
Potassium: 4.2 mmol/L (ref 3.5–5.2)
Sodium: 143 mmol/L (ref 134–144)

## 2019-06-19 LAB — METHYLMALONIC ACID, SERUM: Methylmalonic Acid: 159 nmol/L (ref 0–378)

## 2019-06-19 LAB — B12 AND FOLATE PANEL
Folate: 12.3 ng/mL (ref >3.0)
Vitamin B-12: 371 pg/mL (ref 232–1245)

## 2019-06-24 ENCOUNTER — Telehealth: Payer: Self-pay | Admitting: Neurology

## 2019-06-24 NOTE — Telephone Encounter (Signed)
Pt called wanting to know if the MRI auth request was going through do to her insurance being [employer] [place]. Please advise.

## 2019-06-25 ENCOUNTER — Other Ambulatory Visit: Payer: Self-pay

## 2019-06-25 ENCOUNTER — Ambulatory Visit: Payer: BC Managed Care – PPO | Attending: Neurology | Admitting: Physical Therapy

## 2019-06-25 ENCOUNTER — Encounter: Payer: Self-pay | Admitting: Physical Therapy

## 2019-06-25 DIAGNOSIS — M542 Cervicalgia: Secondary | ICD-10-CM | POA: Diagnosis not present

## 2019-06-25 DIAGNOSIS — M546 Pain in thoracic spine: Secondary | ICD-10-CM | POA: Diagnosis not present

## 2019-06-25 DIAGNOSIS — R2689 Other abnormalities of gait and mobility: Secondary | ICD-10-CM | POA: Diagnosis not present

## 2019-06-25 DIAGNOSIS — R293 Abnormal posture: Secondary | ICD-10-CM | POA: Insufficient documentation

## 2019-06-25 NOTE — Patient Instructions (Signed)
Access Code: [email or web address]  URL: [email or web address]  Date: 06/25/2019  Prepared by: Morton Peters   Patient Education  Trigger Point Dry Needling

## 2019-06-25 NOTE — Therapy (Signed)
[facility] [address] [place], [place], [address] Phone: [phone]   Fax:  [phone]  Physical Therapy Evaluation  Patient Details  Name: Brenda Wilson MRN: [id number] Date of Birth: [birth year] Referring Provider (PT): Melvenia Beam, MD   Encounter Date: 06/25/2019  PT End of Session - 06/25/19 0904    Visit Number  1    Date for PT Re-Evaluation  08/06/19    Authorization Type  BCBS    PT Start Time  0805    PT Stop Time  0855    PT Time Calculation (min)  50 min    Activity Tolerance  Patient tolerated treatment well    Behavior During Therapy  [facility] for tasks assessed/performed       Past Medical History:  Diagnosis Date  . Anxiety   . DDD (degenerative disc disease)   . GERD (gastroesophageal reflux disease)     Past Surgical History:  Procedure Laterality Date  . SPINE SURGERY  2007   L4-L5  . TUBAL LIGATION      There were no vitals filed for this visit.   Subjective Assessment - 06/25/19 0805    Subjective  Pt is referred to PT for stretching and dry needling to address global stiffness and pain associated with adrenoleukodystrophy (ALD).  Pt is a female carrier.  She was diagnosed in 1996.  Pt had onset of symptoms abruptly last year.  She missed work x 6 months due to signif neck and bil arm pain.  She has since returned to work requiring upper body physical work lifting up to 53# boxes.  Prednisone helped her pain but she recently stopped due to side effects, so symptoms are increasing again.  Pain increases at end of work day once she comes home and is static.  She gets very stiff and this limits bed mobility.  Her biggest concern is chest pain with breathing.    Limitations  Sitting;Standing;Walking;House hold activities;Lifting    How long can you sit comfortably?  1 hour    How long can you stand comfortably?  1 hour    How long can you walk comfortably?  2-3 min, limited more by  cardiovascular    Diagnostic tests  MRI of cspine and brain ordered    Patient Stated Goals  relieve muscle pains    Currently in Pain?  Yes    Pain Score  3     Pain Location  Chest    Pain Orientation  Anterior;Lateral;Posterior    Pain Descriptors / Indicators  Tightness;Squeezing    Pain Type  Acute pain    Pain Radiating Towards  Pt also has pain bil neck pain (points to upper traps)    Pain Onset  More than a month ago    Pain Frequency  Constant    Aggravating Factors   after work day, breathing deeply    Pain Relieving Factors  massage, heat, Prednisone in past    Effect of Pain on Daily Activities  transitional movement due to stiffness, getting up in AM, bed mobility, sleep (uses muscle relaxer)         [facility] PT Assessment - 06/25/19 0001      Assessment   Medical Diagnosis  E71.529 (ICD-10-CM) - Adrenoleukodystrophy (HCC) M79.10 (ICD-10-CM) - Muscle pain,  R25.8 (ICD-10-CM) - Clonus    Referring Provider (PT)  Melvenia Beam, MD    Onset Date/Surgical Date  --   Feb 13 2019  Hand Dominance  Right    Prior Therapy  no      Precautions   Precaution Comments  high tone, UMN + signs      Restrictions   Weight Bearing Restrictions  No      Balance Screen   Has the patient fallen in the past 6 months  No    Has the patient had a decrease in activity level because of a fear of falling?   Yes    Is the patient reluctant to leave their home because of a fear of falling?   No      Home Environment   Living Environment  --   home shopping, lives in camper   Additional Comments  lives with husband      Prior Function   Level of Independence  Independent    Vocation  Full time employment    Vocation Requirements  lifts up to 53# boxes    Leisure  yard work      Cognition   Overall Cognitive Status  Within Functional Limits for tasks assessed      Observation/Other Assessments   Observations  + Clonus bil ankles x 5 beats      Coordination   Gross Motor  Movements are Fluid and Coordinated  Yes      Functional Tests   Functional tests  Squat;Step up;Step down;Sit to Stand;Single leg stance      Squat   Comments  independent      Step Up   Comments  ind on stairs without handrails      Step Down   Comments  ind on stairs without rails      Single Leg Stance   Comments  unable to balance >3"      Sit to Stand   Comments  ind without use of UEs, good eccentric control      Posture/Postural Control   Posture/Postural Control  Postural limitations    Postural Limitations  Flexed trunk;Increased lumbar lordosis;Decreased thoracic kyphosis      ROM / Strength   AROM / PROM / Strength  AROM, Strength: bil UE/LEs WFL     AROM   Overall AROM   Deficits    Overall AROM Comments  bil hips WFL, thoracic rot limited 50% with painful stiffness, bil UEs end range limited all planes, stiff slow motion bil    AROM Assessment Site  Cervical;Ankle    Right/Left Ankle  Right;Left    Right Ankle Dorsiflexion  0   neutral   Left Ankle Dorsiflexion  0   neutral   Cervical Flexion  38    Cervical Extension  48    Cervical - Right Side Bend  25    Cervical - Left Side Bend  30    Cervical - Right Rotation  60    Cervical - Left Rotation  55      Flexibility   Soft Tissue Assessment /Muscle Length  yes   limited adductors 30% bil, limited gastroc 20% bil   Hamstrings  limited 20% Rt>Lt      Palpation   Spinal mobility  poor thoracic mobility throughout for bil rot, ext > flexion    Palpation comment  tender and tight bil: diaphragm, thoracic intercostals, thoracic paraspinals, upper traps, scalenes, pectorals, adductors, hamstrings, gastoc      Special Tests   Other special tests  3 min walk test: 600', Pt reported mild cardiovascular challenge end of test  Ambulation/Gait   Gait Pattern  Decreased step length - right;Decreased step length - left;Decreased trunk rotation;Narrow base of support;Poor foot clearance - left;Poor foot  clearance - right      High Level Balance   High Level Balance Comments  cannot balance >3" bil                Objective measurements completed on examination: See above findings.              PT Education - 06/25/19 0914    Education Details  Access Code: [email or web address]    Person(s) Educated  Patient    Methods  Explanation;Demonstration;Handout    Comprehension  Verbalized understanding       PT Short Term Goals - 06/25/19 0915      PT SHORT TERM GOAL #1   Title  Pt will be ind in initial HEP for stretching and ROM    Time  3    Period  Weeks    Status  New    Target Date  07/16/19      PT SHORT TERM GOAL #2   Title  Pt will report at least 20% reduction in stiffness in transfers and with nighttime bed mobility.    Time  4    Period  Weeks    Status  New    Target Date  07/23/19        PT Long Term Goals - 06/25/19 0916      PT LONG TERM GOAL #1   Title  Pt will be ind in advanced HEP to maximize mobility and cardiovascular health.    Time  6    Period  Weeks    Status  New    Target Date  08/06/19      PT LONG TERM GOAL #2   Title  Pt will report at least 50% improvement in cervical and thoracic pain throughout daily tasks.    Time  6    Period  Weeks    Status  New    Target Date  08/06/19      PT LONG TERM GOAL #3   Title  Pt will achieve LE flexibility to [facility] to ease mobility for bending, squatting, and gait.    Time  6    Period  Weeks    Status  New    Target Date  08/06/19      PT LONG TERM GOAL #4   Title  Pt will be able to demo full ribcage expansion 3-dimensionally without pain to improve mobility for cardiovascular endurance.    Time  6    Period  Weeks    Status  New    Target Date  08/06/19      PT LONG TERM GOAL #5   Title  Pt will achieve at least 65 deg cervical rotation bil to improve driving visibility.    Time  6    Period  Weeks    Status  New    Target Date  08/06/19             Plan - 06/25/19 0904     Clinical Impression Statement  Pt is referred for PT to address deficits related to ALD (adrenoleukodystrophy) for which Pt is a female carrier.  Symptoms began abruptly last summer when she started doing physical work.  She continues to work full time and lifts up to 53# boxes.  Strength in bil UEs and LEs is [facility].  She has  global stiffness with diffuse pain in neck and thoracic region, mostly noted with turning head, taking deep breaths, and twisting trunk.  End range motion is stiff in bil shoulders, hips, knees and ankles.  She is ind with transfers, bed mobility, stairs, gait altough stiffness can limit pace of movement.  She has postural and gait abnormalities.  Balance is <3" in bil SLS.  She participated in 3 min walk test today covering 600' with report of mild cardiovascular challenge.  Referring MD would like Pt to trial DN and stretching/massage to address soft tissue tension.  She will benefit from skilled PT to improve ROM, flexibility, functional mobility and cardiovascular endurance to promote max function with less pain and restriction.    Personal Factors and Comorbidities  Comorbidity 1;Comorbidity 2    Comorbidities  + UMN clonus bil ankles, Hx of lumbar laminectomy L4/5    Examination-Activity Limitations  Locomotion Level;Transfers;Bed Mobility;Bend;Sit;Sleep;Squat;Stand;Reach Overhead    Examination-Participation Restrictions  Community Activity;Yard Work    Stability/Clinical Decision Making  Stable/Uncomplicated    Clinical Decision Making  Low    Rehab Potential  Good    PT Frequency  2x / week    PT Duration  6 weeks    PT Treatment/Interventions  ADLs/Self Care Home Management;Cryotherapy;Electrical Stimulation;Moist Heat;Gait training;Stair training;Functional mobility training;Therapeutic activities;Therapeutic exercise;Balance training;Neuromuscular re-education;Manual techniques;Patient/family education;Passive range of motion;Dry needling;Spinal Manipulations;Joint  Manipulations;Taping    PT Next Visit Plan  DN cervical and thoracic, manual techniques for thoracic mobility including STM diaphragm, begin HEP for LE, trunk, neck ROM and stretching, thoracic breath cueing    PT Home Exercise Plan  Access Code: [email or web address] (DN handout)    Consulted and Agree with Plan of Care  Patient       Patient will benefit from skilled therapeutic intervention in order to improve the following deficits and impairments:  Abnormal gait, Decreased range of motion, Decreased endurance, Increased muscle spasms, Impaired UE functional use, Decreased activity tolerance, Pain, Decreased balance, Hypomobility, Impaired flexibility, Improper body mechanics, Decreased mobility, Postural dysfunction  Visit Diagnosis: Cervicalgia - Plan: PT plan of care cert/re-cert  Pain in thoracic spine - Plan: PT plan of care cert/re-cert  Abnormal posture - Plan: PT plan of care cert/re-cert  Other abnormalities of gait and mobility - Plan: PT plan of care cert/re-cert     Problem List Patient Active Problem List   Diagnosis Date Noted  . Anxiety 09/19/2018  . Costochondral chest pain 09/19/2018  . Genetic carrier of other disease 09/21/2017  . Prediabetes 09/21/2017  . Myalgia 09/21/2017    Morton Peters, PT 06/25/19 9:24 AM   [facility] [address] [place], [place], [address] Phone: [phone]   Fax:  [phone]  Name: Brenda Wilson MRN: [id number] Date of Birth: [birth year]

## 2019-06-25 NOTE — Telephone Encounter (Signed)
Called and left patient a message re turing her call soon as we can we are short staffed.

## 2019-06-26 NOTE — Telephone Encounter (Signed)
I called and scheduled the patient for her MRI. We scheduled for Jan 12. She wants to know if she can write a check for the 14th since that is when she gets paid? Please advise.

## 2019-06-30 NOTE — Telephone Encounter (Signed)
Packet created and ready for appt tomorrow.

## 2019-07-01 ENCOUNTER — Other Ambulatory Visit: Payer: Self-pay | Admitting: Neurology

## 2019-07-01 ENCOUNTER — Telehealth: Payer: Self-pay | Admitting: *Deleted

## 2019-07-01 ENCOUNTER — Ambulatory Visit: Payer: BC Managed Care – PPO

## 2019-07-01 ENCOUNTER — Other Ambulatory Visit: Payer: Self-pay

## 2019-07-01 MED ORDER — ALPRAZOLAM 0.25 MG PO TABS: ORAL_TABLET | ORAL | 0 refills | Status: DC

## 2019-07-01 NOTE — Telephone Encounter (Signed)
Per Sela Hua w/ MRI, pt came today and was claustrophobic, unable to complete MRI brain & c-spine. He requests anti-anxiety medication and stated the pt can r/s for next week. I advised a request would be sent to Dr. Lucia Gaskins and we will notify the patient.

## 2019-07-01 NOTE — Telephone Encounter (Signed)
Xanax sent to pharmacy with instructions, can cause sedation, have someone drive her to and back from the appointment

## 2019-07-02 ENCOUNTER — Encounter: Payer: Self-pay | Admitting: Physical Therapy

## 2019-07-02 ENCOUNTER — Other Ambulatory Visit: Payer: Self-pay

## 2019-07-02 ENCOUNTER — Ambulatory Visit: Payer: BC Managed Care – PPO | Admitting: Physical Therapy

## 2019-07-02 DIAGNOSIS — R2689 Other abnormalities of gait and mobility: Secondary | ICD-10-CM | POA: Diagnosis not present

## 2019-07-02 DIAGNOSIS — M546 Pain in thoracic spine: Secondary | ICD-10-CM

## 2019-07-02 DIAGNOSIS — M542 Cervicalgia: Secondary | ICD-10-CM

## 2019-07-02 DIAGNOSIS — R293 Abnormal posture: Secondary | ICD-10-CM

## 2019-07-02 NOTE — Therapy (Signed)
[facility] [address], [place], [place], [address] Phone: [phone]   Fax:  [phone]  Physical Therapy Treatment  Patient Details  Name: Brenda Wilson MRN: [id number] Date of Birth: 06-May-1971 Referring Provider (PT): Melvenia Beam, MD   Encounter Date: 07/02/2019  PT End of Session - 07/02/19 0841    Visit Number  2    Date for PT Re-Evaluation  08/06/19    Authorization Type  BCBS    PT Start Time  0756    PT Stop Time  7893    PT Time Calculation (min)  41 min    Activity Tolerance  Patient tolerated treatment well;No increased pain    Behavior During Therapy  WFL for tasks assessed/performed       Past Medical History:  Diagnosis Date  . Anxiety   . DDD (degenerative disc disease)   . GERD (gastroesophageal reflux disease)     Past Surgical History:  Procedure Laterality Date  . SPINE SURGERY  2007   L4-L5  . TUBAL LIGATION      There were no vitals filed for this visit.  Subjective Assessment - 07/02/19 0759    Subjective  Pt states that her neck is still sore when turning her head.    Limitations  Sitting;Standing;Walking;House hold activities;Lifting    How long can you sit comfortably?  1 hour    How long can you stand comfortably?  1 hour    How long can you walk comfortably?  2-3 min, limited more by cardiovascular    Diagnostic tests  MRI of cspine and brain ordered    Patient Stated Goals  relieve muscle pains    Currently in Pain?  Yes    Pain Score  6     Pain Location  --   bilateral upper traps   Pain Orientation  Right;Left    Pain Descriptors / Indicators  Aching;Sore    Pain Type  Acute pain    Pain Radiating Towards  none    Pain Onset  More than a month ago    Pain Frequency  Constant    Aggravating Factors   turning the head                       [facility] Adult PT Treatment/Exercise - 07/02/19 0001      Exercises   Exercises  Neck      Neck  Exercises: Standing   Other Standing Exercises  self trigger point release with tennis ball against wall       Neck Exercises: Sidelying   Other Sidelying Exercise  thoracic open book stretch x10 reps each       Manual Therapy   Manual Therapy  Joint mobilization;Soft tissue mobilization    Joint Mobilization  CPAs Grade III-IV x2 bouts upper thoracic spine     Soft tissue mobilization  STM and trigger point release Rt and Lt levator scap, upper thoracic paraspinals, B upper traps       Neck Exercises: Stretches   Levator Stretch  2 reps;Left;Right;20 seconds    Levator Stretch Limitations  seated     Other Neck Stretches  pec stretch in doorway 2x20 sec        Trigger Point Dry Needling - 07/02/19 0001    Consent Given?  Yes    Education Handout Provided  Previously provided    Muscles Treated Head and Neck  Upper trapezius  Upper Trapezius Response  Twitch reponse elicited;Palpable increased muscle length   Rt/Lt          PT Education - 07/02/19 0840    Education Details  [email or web address]    Person(s) Educated  Patient    Methods  Explanation;Handout    Comprehension  Verbalized understanding;Returned demonstration       PT Short Term Goals - 06/25/19 0915      PT SHORT TERM GOAL #1   Title  Pt will be ind in initial HEP for stretching and ROM    Time  3    Period  Weeks    Status  New    Target Date  07/16/19      PT SHORT TERM GOAL #2   Title  Pt will report at least 20% reduction in stiffness in transfers and with nighttime bed mobility.    Time  4    Period  Weeks    Status  New    Target Date  07/23/19        PT Long Term Goals - 06/25/19 0916      PT LONG TERM GOAL #1   Title  Pt will be ind in advanced HEP to maximize mobility and cardiovascular health.    Time  6    Period  Weeks    Status  New    Target Date  08/06/19      PT LONG TERM GOAL #2   Title  Pt will report at least 50% improvement in cervical and thoracic pain throughout daily tasks.     Time  6    Period  Weeks    Status  New    Target Date  08/06/19      PT LONG TERM GOAL #3   Title  Pt will achieve LE flexibility to [facility] to ease mobility for bending, squatting, and gait.    Time  6    Period  Weeks    Status  New    Target Date  08/06/19      PT LONG TERM GOAL #4   Title  Pt will be able to demo full ribcage expansion 3-dimensionally without pain to improve mobility for cardiovascular endurance.    Time  6    Period  Weeks    Status  New    Target Date  08/06/19      PT LONG TERM GOAL #5   Title  Pt will achieve at least 65 deg cervical rotation bil to improve driving visibility.    Time  6    Period  Weeks    Status  New    Target Date  08/06/19            Plan - 07/02/19 3710    Clinical Impression Statement  Pt arrived with continued soreness in the neck, expected due to this only being her second session. Focused on therex to promote thoracic, chest and cervical flexibility. Pt required PT cuing to improve her technique with thoracic rotation secondary to compensations with cervical rotation during this. Pt was agreeable to dry needling treatment end of session. There were several twitch responses noted, but pt was having a difficult time keeping her body relaxed. PT then completed more soft tissue techniques end of session to address other areas in the upper back/neck. Ended with mild soreness in the upper traps following dry needling.    Personal Factors and Comorbidities  Comorbidity 1;Comorbidity 2    Comorbidities  + UMN  clonus bil ankles, Hx of lumbar laminectomy L4/5    Examination-Activity Limitations  Locomotion Level;Transfers;Bed Mobility;Bend;Sit;Sleep;Squat;Stand;Reach Overhead    Examination-Participation Restrictions  Community Activity;Yard Work    Stability/Clinical Decision Making  Stable/Uncomplicated    Rehab Potential  Good    PT Frequency  2x / week    PT Duration  6 weeks    PT Treatment/Interventions  ADLs/Self Care Home  Management;Cryotherapy;Electrical Stimulation;Moist Heat;Gait training;Stair training;Functional mobility training;Therapeutic activities;Therapeutic exercise;Balance training;Neuromuscular re-education;Manual techniques;Patient/family education;Passive range of motion;Dry needling;Spinal Manipulations;Joint Manipulations;Taping    PT Next Visit Plan  DN cervical multifidi, manual techniques for thoracic mobility including STM diaphragm, neck ROM and stretching, thoracic breath cueing    PT Home Exercise Plan  [email or web address]    Consulted and Agree with Plan of Care  Patient       Patient will benefit from skilled therapeutic intervention in order to improve the following deficits and impairments:  Abnormal gait, Decreased range of motion, Decreased endurance, Increased muscle spasms, Impaired UE functional use, Decreased activity tolerance, Pain, Decreased balance, Hypomobility, Impaired flexibility, Improper body mechanics, Decreased mobility, Postural dysfunction  Visit Diagnosis: Cervicalgia  Pain in thoracic spine  Abnormal posture  Other abnormalities of gait and mobility     Problem List Patient Active Problem List   Diagnosis Date Noted  . Anxiety 09/19/2018  . Costochondral chest pain 09/19/2018  . Genetic carrier of other disease 09/21/2017  . Prediabetes 09/21/2017  . Myalgia 09/21/2017    8:50 AM,07/02/19 Donita Brooks PT, DPT [facility]  [phone]  [facility] [address], [address] [place], [place], [address] Phone: [phone]   Fax:  [phone]  Name: Brenda Wilson MRN: [id number] Date of Birth: [birth year]

## 2019-07-02 NOTE — Telephone Encounter (Signed)
I called the pt & LVM (ok per DPR) advising we were aware of pt's difficulty w/ MRI yesterday d/t claustrophobia. I advised her that Dr. Lucia Gaskins prescribed Xanax .25 mg to [facility] with instructions to take 1-2 tablets 30-60 minutes before procedure/MRI, may repeat if needed. Advised that she not drive as this medication can cause sedation. Driver needed to and from MRI as long as she is on the medication. Left office number for call back if needed or pt can send mychart message. Also asked that pt call us back if she had not already rescheduled her MRI.

## 2019-07-02 NOTE — Telephone Encounter (Signed)
Paitient returned call and wanted to know if we can schedule her somewhere , there is an open MRI machine, she states she doesn't want to get in the other due to anxiety and she doesn't want to chance taking the medication and it not working

## 2019-07-03 NOTE — Telephone Encounter (Signed)
MRI has been sent to [facility] Thanks Annabelle Harman

## 2019-07-04 ENCOUNTER — Other Ambulatory Visit: Payer: Self-pay

## 2019-07-04 ENCOUNTER — Encounter: Payer: Self-pay | Admitting: Physical Therapy

## 2019-07-04 ENCOUNTER — Ambulatory Visit: Payer: BC Managed Care – PPO | Admitting: Physical Therapy

## 2019-07-04 DIAGNOSIS — M546 Pain in thoracic spine: Secondary | ICD-10-CM

## 2019-07-04 DIAGNOSIS — M542 Cervicalgia: Secondary | ICD-10-CM | POA: Diagnosis not present

## 2019-07-04 DIAGNOSIS — R293 Abnormal posture: Secondary | ICD-10-CM

## 2019-07-04 DIAGNOSIS — R2689 Other abnormalities of gait and mobility: Secondary | ICD-10-CM

## 2019-07-04 NOTE — Patient Instructions (Signed)
Access Code: [email or web address]  URL: [email or web address]  Date: 07/04/2019  Prepared by: Loistine Simas Taishawn Smaldone   Exercises  Sidelying Thoracic and Shoulder Rotation - 10 reps - 2x daily - 7x weekly  Gentle Levator Scapulae Stretch - 2 reps - 20 hold - 2x daily - 7x weekly  Seated Gentle Upper Trapezius Stretch - 10 reps - 3 sets - 1x daily - 7x weekly  Doorway Pec Stretch at 60 Elevation - 2 reps - 20 hold - 2x daily - 7x weekly  Seated Thoracic Flexion and Rotation with Arms Crossed - 2 reps - 1 sets - 15 hold - 1x daily - 7x weekly  Supine Chin Tuck - 10 reps - 1 sets - 5 hold - 1x daily - 7x weekly

## 2019-07-04 NOTE — Therapy (Signed)
[facility] [address], [place], [place], [address] Phone: [phone]   Fax:  [phone]  Physical Therapy Treatment  Patient Details  Name: Brenda Wilson MRN: [id number] Date of Birth: [birth year] Referring Provider (PT): Melvenia Beam, MD   Encounter Date: 07/04/2019  PT End of Session - 07/04/19 0758    Visit Number  3    Date for PT Re-Evaluation  08/06/19    Authorization Type  BCBS    PT Start Time  2993    PT Stop Time  7169    PT Time Calculation (min)  49 min    Activity Tolerance  Patient tolerated treatment well;No increased pain    Behavior During Therapy  WFL for tasks assessed/performed       Past Medical History:  Diagnosis Date  . Anxiety   . DDD (degenerative disc disease)   . GERD (gastroesophageal reflux disease)     Past Surgical History:  Procedure Laterality Date  . SPINE SURGERY  2007   L4-L5  . TUBAL LIGATION      There were no vitals filed for this visit.  Subjective Assessment - 07/04/19 0758    Subjective  Sore from needling but maybe muscles are a bit softer.    Limitations  Sitting;Standing;Walking;House hold activities;Lifting    How long can you sit comfortably?  1 hour    How long can you stand comfortably?  1 hour    How long can you walk comfortably?  2-3 min, limited more by cardiovascular    Diagnostic tests  MRI of cspine and brain ordered    Patient Stated Goals  relieve muscle pains    Currently in Pain?  Yes    Pain Score  7     Pain Location  --   global stiffness and pain, chest, neck, legs   Pain Orientation  Right;Left    Pain Descriptors / Indicators  Aching;Sore    Pain Type  Acute pain    Pain Onset  More than a month ago    Pain Frequency  Constant    Aggravating Factors   turning head    Pain Relieving Factors  massage, heat    Effect of Pain on Daily Activities  AM rising, sleep, all movement                       [facility]  Adult PT Treatment/Exercise - 07/04/19 0001      Exercises   Exercises  Neck;Shoulder      Neck Exercises: Seated   Other Seated Exercise  upper trap and levator stretch 2x20 sec each    Other Seated Exercise  seated thoracic rotation with cue to sit tall and breathe into stretch 2x15 sec each      Neck Exercises: Supine   Neck Retraction  5 reps;5 secs    Neck Retraction Limitations  added to HEP      Shoulder Exercises: ROM/Strengthening   UBE (Upper Arm Bike)  L1 1x1 fwd/bwd, PT present to discuss progress      Modalities   Modalities  Moist Heat      Moist Heat Therapy   Number Minutes Moist Heat  5 Minutes    Moist Heat Location  Cervical   end of session     Manual Therapy   Joint Mobilization  O/A and AA joint mobs Gr I-III, sideglides, downglides upper thoracic Gr II/III    Soft tissue  mobilization  upper traps, scalenes, suboccip bil in supine       Trigger Point Dry Needling - 07/04/19 0001    Consent Given?  Yes    Education Handout Provided  Previously provided    Muscles Treated Head and Neck  Suboccipitals    Other Dry Needling  bil    Suboccipitals Response  Twitch response elicited;Palpable increased muscle length           PT Education - 07/04/19 0841    Education Details  Access Code: [employer]    Person(s) Educated  Patient    Methods  Explanation;Demonstration;Verbal cues;Handout    Comprehension  Verbalized understanding;Returned demonstration       PT Short Term Goals - 07/04/19 0758      PT SHORT TERM GOAL #1   Title  Pt will be ind in initial HEP for stretching and ROM    Status  On-going        PT Long Term Goals - 06/25/19 0916      PT LONG TERM GOAL #1   Title  Pt will be ind in advanced HEP to maximize mobility and cardiovascular health.    Time  6    Period  Weeks    Status  New    Target Date  08/06/19      PT LONG TERM GOAL #2   Title  Pt will report at least 50% improvement in cervical and thoracic pain throughout  daily tasks.    Time  6    Period  Weeks    Status  New    Target Date  08/06/19      PT LONG TERM GOAL #3   Title  Pt will achieve LE flexibility to [facility] to ease mobility for bending, squatting, and gait.    Time  6    Period  Weeks    Status  New    Target Date  08/06/19      PT LONG TERM GOAL #4   Title  Pt will be able to demo full ribcage expansion 3-dimensionally without pain to improve mobility for cardiovascular endurance.    Time  6    Period  Weeks    Status  New    Target Date  08/06/19      PT LONG TERM GOAL #5   Title  Pt will achieve at least 65 deg cervical rotation bil to improve driving visibility.    Time  6    Period  Weeks    Status  New    Target Date  08/06/19            Plan - 07/04/19 0845    Clinical Impression Statement  PT performed DN to subocciptals today with Pt report of relief of frontal headache afterwards.  She continues to demo signif soft tissue restrictions limiting movement.  She was able to improve pain with seated thoracic rotation with cue to breathe into position while holding.  PT introduced neck retraction and added upper trap stretch and seated thoracic rotation to HEP.  PT has new onset of Lt bicep pain which presents in painful arc with Lt UE flexion/abduction.  Pt will likely benefit from intro of scapular strength next visit.  Continue along POC with ongoing assessment of response to treatment.    Comorbidities  + UMN clonus bil ankles, Hx of lumbar laminectomy L4/5    Rehab Potential  Good    PT Frequency  2x / week  PT Duration  6 weeks    PT Treatment/Interventions  ADLs/Self Care Home Management;Cryotherapy;Electrical Stimulation;Moist Heat;Gait training;Stair training;Functional mobility training;Therapeutic activities;Therapeutic exercise;Balance training;Neuromuscular re-education;Manual techniques;Patient/family education;Passive range of motion;Dry needling;Spinal Manipulations;Joint Manipulations;Taping    PT Next  Visit Plan  DN cervical and upper thoracic multifidi, intro scapular strength row/ext, continue manual techniques, heat    PT Home Exercise Plan  [email or web address]    Consulted and Agree with Plan of Care  Patient       Patient will benefit from skilled therapeutic intervention in order to improve the following deficits and impairments:     Visit Diagnosis: Cervicalgia  Pain in thoracic spine  Abnormal posture  Other abnormalities of gait and mobility     Problem List Patient Active Problem List   Diagnosis Date Noted  . Anxiety 09/19/2018  . Costochondral chest pain 09/19/2018  . Genetic carrier of other disease 09/21/2017  . Prediabetes 09/21/2017  . Myalgia 09/21/2017    Morton Peters, PT 07/04/19 8:48 AM   [facility] [address] [place], [place], [address] Phone: [phone]   Fax:  [phone]  Name: SHARAY BELLISSIMO MRN: [id number] Date of Birth: 1971/04/11

## 2019-07-08 ENCOUNTER — Ambulatory Visit: Payer: BC Managed Care – PPO | Admitting: Physical Therapy

## 2019-07-08 ENCOUNTER — Other Ambulatory Visit: Payer: Self-pay

## 2019-07-08 ENCOUNTER — Encounter: Payer: Self-pay | Admitting: Physical Therapy

## 2019-07-08 DIAGNOSIS — R2689 Other abnormalities of gait and mobility: Secondary | ICD-10-CM

## 2019-07-08 DIAGNOSIS — R293 Abnormal posture: Secondary | ICD-10-CM | POA: Diagnosis not present

## 2019-07-08 DIAGNOSIS — M542 Cervicalgia: Secondary | ICD-10-CM | POA: Diagnosis not present

## 2019-07-08 DIAGNOSIS — M546 Pain in thoracic spine: Secondary | ICD-10-CM | POA: Diagnosis not present

## 2019-07-08 NOTE — Therapy (Signed)
[facility] [address], [place], [place], [address] Phone: [phone]   Fax:  [phone]  Physical Therapy Treatment  Patient Details  Name: Brenda Wilson MRN: [id number] Date of Birth: [birth year] Referring Provider (PT): Melvenia Beam, MD   Encounter Date: 07/08/2019  PT End of Session - 07/08/19 1000    Visit Number  4    Date for PT Re-Evaluation  08/06/19    Authorization Type  BCBS    PT Start Time  0800    PT Stop Time  0845    PT Time Calculation (min)  45 min    Activity Tolerance  Patient tolerated treatment well;No increased pain    Behavior During Therapy  WFL for tasks assessed/performed       Past Medical History:  Diagnosis Date  . Anxiety   . DDD (degenerative disc disease)   . GERD (gastroesophageal reflux disease)     Past Surgical History:  Procedure Laterality Date  . SPINE SURGERY  2007   L4-L5  . TUBAL LIGATION      There were no vitals filed for this visit.  Subjective Assessment - 07/08/19 0802    Subjective  Pt states that she is still sore above her shoulders but the muscles feel "softer". Still having pain with reaching with the Lt UE. She feels that her difficulty with movement is returning to the level it was before she started her prednisone.    Limitations  Sitting;Standing;Walking;House hold activities;Lifting    How long can you sit comfortably?  1 hour    How long can you stand comfortably?  1 hour    How long can you walk comfortably?  2-3 min, limited more by cardiovascular    Diagnostic tests  MRI of cspine and brain ordered    Patient Stated Goals  relieve muscle pains    Pain Onset  More than a month ago                       [facility] Adult PT Treatment/Exercise - 07/08/19 0001      Neuro Re-ed    Neuro Re-ed Details   deep breathing with adequate rib expansion, tactile cuing to increase understanding of this in supine and seated positions        Neck Exercises: Machines for Strengthening   UBE (Upper Arm Bike)  L1 x4 min forward, PT present to discuss session       Neck Exercises: Supine   Other Supine Exercise  gentle neck retraction over foam roll with adominal bracing and alternating hip flexion x10 reps       Neck Exercises: Sidelying   Other Sidelying Exercise  thoracic rotation stretch with PT overpressure 3x15sec hold each direction      Shoulder Exercises: Standing   Extension  Strengthening;Both;10 reps    Theraband Level (Shoulder Extension)  Level 2 (Red)    Row  Strengthening;Both;10 reps;Theraband    Theraband Level (Shoulder Row)  Level 2 (Red)    Row Limitations  PT cuing to encourage scap relaxation between reps       Manual Therapy   Joint Mobilization  upper thoracic     Soft tissue mobilization  upper traps, thoracic paraspinals, rhomboids        Trigger Point Dry Needling - 07/08/19 0001    Consent Given?  Yes    Education Handout Provided  Previously provided    Muscles Treated Head and  Neck  Cervical multifidi    Muscles Treated Back/Hip  Thoracic multifidi    Upper Trapezius Response  Twitch reponse elicited;Palpable increased muscle length   Lt/Rt    Cervical multifidi Response  Twitch reponse elicited;Palpable increased muscle length   C7 Lt/Rt    Thoracic multifidi response  Twitch response elicited;Palpable increased muscle length   T1 Lt/Rt          PT Education - 07/08/19 1000    Education Details  breathing education    Person(s) Educated  Patient    Methods  Explanation;Verbal cues;Demonstration;Tactile cues    Comprehension  Verbalized understanding;Returned demonstration       PT Short Term Goals - 07/04/19 0758      PT SHORT TERM GOAL #1   Title  Pt will be ind in initial HEP for stretching and ROM    Status  On-going        PT Long Term Goals - 06/25/19 0916      PT LONG TERM GOAL #1   Title  Pt will be ind in advanced HEP to maximize mobility and  cardiovascular health.    Time  6    Period  Weeks    Status  New    Target Date  08/06/19      PT LONG TERM GOAL #2   Title  Pt will report at least 50% improvement in cervical and thoracic pain throughout daily tasks.    Time  6    Period  Weeks    Status  New    Target Date  08/06/19      PT LONG TERM GOAL #3   Title  Pt will achieve LE flexibility to [facility] to ease mobility for bending, squatting, and gait.    Time  6    Period  Weeks    Status  New    Target Date  08/06/19      PT LONG TERM GOAL #4   Title  Pt will be able to demo full ribcage expansion 3-dimensionally without pain to improve mobility for cardiovascular endurance.    Time  6    Period  Weeks    Status  New    Target Date  08/06/19      PT LONG TERM GOAL #5   Title  Pt will achieve at least 65 deg cervical rotation bil to improve driving visibility.    Time  6    Period  Weeks    Status  New    Target Date  08/06/19            Plan - 07/08/19 1002    Clinical Impression Statement  Pt has been noticing recent return of her body stiffness and difficulty moving. Dry needling appears to be helping the stiffness in her neck, as she noted minimal issues with this upon arrival. Due to recent Lt anterior shoulder pain with use, scapula strengthening was introduced. Pt requires consistent PT verbal cuing to improve her awareness of her posture and to encourage relaxation between repetitions. Completed dry needling again to the upper traps and upper thoracic spine with several twitch responses noted. Pt denied increase in pain end of session. Will continue with current POC.    Comorbidities  + UMN clonus bil ankles, Hx of lumbar laminectomy L4/5    Rehab Potential  Good    PT Frequency  2x / week    PT Duration  6 weeks    PT Treatment/Interventions  ADLs/Self  Care Home Management;Cryotherapy;Electrical Stimulation;Moist Heat;Gait training;Stair training;Functional mobility training;Therapeutic  activities;Therapeutic exercise;Balance training;Neuromuscular re-education;Manual techniques;Patient/family education;Passive range of motion;Dry needling;Spinal Manipulations;Joint Manipulations;Taping    PT Next Visit Plan  progress scapular strength row/ext, breathing/relaxation techniques; continue manual techniques, heat    PT Home Exercise Plan  [email or web address]    Consulted and Agree with Plan of Care  Patient       Patient will benefit from skilled therapeutic intervention in order to improve the following deficits and impairments:     Visit Diagnosis: Cervicalgia  Pain in thoracic spine  Abnormal posture  Other abnormalities of gait and mobility     Problem List Patient Active Problem List   Diagnosis Date Noted  . Anxiety 09/19/2018  . Costochondral chest pain 09/19/2018  . Genetic carrier of other disease 09/21/2017  . Prediabetes 09/21/2017  . Myalgia 09/21/2017   10:09 AM,07/08/19 Donita Brooks PT, DPT [facility]  [phone]  [facility] [address], [address] [place], [place], [address] Phone: [phone]   Fax:  [phone]  Name: KAELEE PFEFFER MRN: [id number] Date of Birth: 1970-08-05

## 2019-07-09 NOTE — Telephone Encounter (Signed)
Noted, thank you. DWD  

## 2019-07-10 ENCOUNTER — Encounter: Payer: Self-pay | Admitting: Physical Therapy

## 2019-07-10 ENCOUNTER — Ambulatory Visit: Payer: BC Managed Care – PPO | Admitting: Physical Therapy

## 2019-07-10 ENCOUNTER — Other Ambulatory Visit: Payer: Self-pay

## 2019-07-10 DIAGNOSIS — R2689 Other abnormalities of gait and mobility: Secondary | ICD-10-CM | POA: Diagnosis not present

## 2019-07-10 DIAGNOSIS — M546 Pain in thoracic spine: Secondary | ICD-10-CM | POA: Diagnosis not present

## 2019-07-10 DIAGNOSIS — R293 Abnormal posture: Secondary | ICD-10-CM | POA: Diagnosis not present

## 2019-07-10 DIAGNOSIS — M542 Cervicalgia: Secondary | ICD-10-CM | POA: Diagnosis not present

## 2019-07-10 NOTE — Therapy (Signed)
[facility] [address], [address] [place], [place], [address] Phone: [phone]   Fax:  [phone]  Physical Therapy Treatment  Patient Details  Name: Brenda Wilson MRN: [id number] Date of Birth: 09/06/1970 Referring Provider (PT): Anson Fret, MD   Encounter Date: 07/10/2019  PT End of Session - 07/10/19 1057    Visit Number  5    Date for PT Re-Evaluation  08/06/19    Authorization Type  BCBS    PT Start Time  0930    PT Stop Time  1014    PT Time Calculation (min)  44 min    Activity Tolerance  Patient tolerated treatment well;No increased pain    Behavior During Therapy  WFL for tasks assessed/performed       Past Medical History:  Diagnosis Date  . Anxiety   . DDD (degenerative disc disease)   . GERD (gastroesophageal reflux disease)     Past Surgical History:  Procedure Laterality Date  . SPINE SURGERY  2007   L4-L5  . TUBAL LIGATION      There were no vitals filed for this visit.  Subjective Assessment - 07/10/19 0948    Subjective  Pt feels that her upper traps are still tight today. The neck portion feels much more loose. She wishes she could do something to help the stiffness when she wakes up in the mornings.    Limitations  Sitting;Standing;Walking;House hold activities;Lifting    How long can you sit comfortably?  1 hour    How long can you stand comfortably?  1 hour    How long can you walk comfortably?  2-3 min, limited more by cardiovascular    Diagnostic tests  MRI of cspine and brain ordered    Patient Stated Goals  relieve muscle pains    Currently in Pain?  Other (Comment)   still just sore above the shoulders   Pain Onset  More than a month ago                       [facility] Adult PT Treatment/Exercise - 07/10/19 0001      Exercises   Exercises  Other Exercises    Other Exercises   reviewed set up of butterfly stretch, single knee to chest and thoracic rotation  for home in the mornings      Neck Exercises: Supine   Neck Retraction  10 reps;5 secs    Neck Retraction Limitations  head resting on red physioball, PT assisting ball depression for cuing pt on proper technique    Cervical Rotation  10 reps    Cervical Rotation Limitations  with neck retraction over red physioball       Shoulder Exercises: Supine   Horizontal ABduction Limitations  attempted with yellow TB x5 reps (+) pain in Lt bicep, removed TB but pt continued to have Lt bicep pain x5 reps       Manual Therapy   Joint Mobilization  Lt AP rib 5-7 mobilization grade III; thoracic CPAs grade III-IV T4 to T8; Lt rib mobilization during trunk rotation to assist with superior glide x10 reps      Soft tissue mobilization  Lt obliques; passive pec stretch 5x10 sec              PT Education - 07/10/19 1015    Education Details  technique with therex    Person(s) Educated  Patient    Methods  Explanation;Handout;Verbal  cues    Comprehension  Verbalized understanding;Returned demonstration       PT Short Term Goals - 07/04/19 0758      PT SHORT TERM GOAL #1   Title  Pt will be ind in initial HEP for stretching and ROM    Status  On-going        PT Long Term Goals - 06/25/19 0916      PT LONG TERM GOAL #1   Title  Pt will be ind in advanced HEP to maximize mobility and cardiovascular health.    Time  6    Period  Weeks    Status  New    Target Date  08/06/19      PT LONG TERM GOAL #2   Title  Pt will report at least 50% improvement in cervical and thoracic pain throughout daily tasks.    Time  6    Period  Weeks    Status  New    Target Date  08/06/19      PT LONG TERM GOAL #3   Title  Pt will achieve LE flexibility to [facility] to ease mobility for bending, squatting, and gait.    Time  6    Period  Weeks    Status  New    Target Date  08/06/19      PT LONG TERM GOAL #4   Title  Pt will be able to demo full ribcage expansion 3-dimensionally without pain to improve  mobility for cardiovascular endurance.    Time  6    Period  Weeks    Status  New    Target Date  08/06/19      PT LONG TERM GOAL #5   Title  Pt will achieve at least 65 deg cervical rotation bil to improve driving visibility.    Time  6    Period  Weeks    Status  New    Target Date  08/06/19            Plan - 07/10/19 1058    Clinical Impression Statement  Pt continues to have anterior chest discomfort with deep inhale throughout the day. She has palpable tenderness of the obliques and restrictions in pectoral and thoracic mobility. PT completed mobilizations to the ribs and thoracic spine and provided pt with self-mobilization for home. Pt felt less stiff and denied rib pain end of the session. Will continue with current POC.    Comorbidities  + UMN clonus bil ankles, Hx of lumbar laminectomy L4/5    Rehab Potential  Good    PT Frequency  2x / week    PT Duration  6 weeks    PT Treatment/Interventions  ADLs/Self Care Home Management;Cryotherapy;Electrical Stimulation;Moist Heat;Gait training;Stair training;Functional mobility training;Therapeutic activities;Therapeutic exercise;Balance training;Neuromuscular re-education;Manual techniques;Patient/family education;Passive range of motion;Dry needling;Spinal Manipulations;Joint Manipulations;Taping    PT Next Visit Plan  progress scapular strength row/ext, breathing/relaxation techniques; continue manual techniques, heat    PT Home Exercise Plan  [email or web address]    Consulted and Agree with Plan of Care  Patient       Patient will benefit from skilled therapeutic intervention in order to improve the following deficits and impairments:     Visit Diagnosis: Cervicalgia  Pain in thoracic spine  Abnormal posture  Other abnormalities of gait and mobility     Problem List Patient Active Problem List   Diagnosis Date Noted  . Anxiety 09/19/2018  . Costochondral chest pain 09/19/2018  . Genetic carrier of  other disease 09/21/2017   . Prediabetes 09/21/2017  . Myalgia 09/21/2017    11:02 AM,07/10/19 Donita Brooks PT, DPT [facility]  [phone]  [facility] [address], [address] [place], [place], [address] Phone: [phone]   Fax:  [phone]  Name: Brenda Wilson MRN: [id number] Date of Birth: 1970-10-14

## 2019-07-10 NOTE — Patient Instructions (Signed)
Access Code: [email or web address]  URL: [email or web address]  Date: 07/10/2019  Prepared by: Donita Brooks    Exercises Sidelying Thoracic and Shoulder Rotation- 10 reps- 2x daily- 7x weekly  Gentle Levator Scapulae Stretch- 2 reps- 20 hold- 2x daily- 7x weekly  Seated Gentle Upper Trapezius Stretch- 10 reps- 3 sets- 1x daily- 7x weekly  Doorway Pec Stretch at 60 Elevation- 2 reps- 20 hold- 2x daily- 7x weekly  Seated Thoracic Flexion and Rotation with Arms Crossed- 2 reps- 1 sets- 15 hold- 1x daily- 7x weekly  Supine Chin Tuck- 10 reps- 1 sets- 5 hold- 1x daily- 7x weekly  Seated Thoracic Lumbar Extension with Pectoralis Stretch- 10 reps- 3 sets- 1x daily- 7x weekly   [facility] [address] [place], [place] [address] Phone # [phone] Fax [phone]

## 2019-07-14 ENCOUNTER — Encounter: Payer: BC Managed Care – PPO | Admitting: Physical Therapy

## 2019-07-14 DIAGNOSIS — R258 Other abnormal involuntary movements: Secondary | ICD-10-CM | POA: Diagnosis not present

## 2019-07-14 DIAGNOSIS — M4802 Spinal stenosis, cervical region: Secondary | ICD-10-CM | POA: Diagnosis not present

## 2019-07-15 ENCOUNTER — Telehealth: Payer: Self-pay | Admitting: Neurology

## 2019-07-15 NOTE — Telephone Encounter (Signed)
MRI of the brain and cervical spine neuroradiology reports were unremarkable, essentially normal. I have not reviewed the actual imaging but awaiting the disks and we can review images and results at her emg/ncs appointment thanks.

## 2019-07-15 NOTE — Telephone Encounter (Signed)
Pt called and LVM wanting RN or provider to go over MRI results with her. Please advise.

## 2019-07-16 ENCOUNTER — Ambulatory Visit: Payer: BC Managed Care – PPO | Admitting: Physical Therapy

## 2019-07-16 NOTE — Telephone Encounter (Signed)
I spoke with the pt and discussed the message from Dr. Lucia Gaskins regarding report of MRI brain & c-spine being unremarkable, essentially normal and that Dr. Lucia Gaskins is waiting on the disc. Pt advised Dr Lucia Gaskins will review images and results at the EMG/NCV appt. Pt's questions were answered. She verbalized appreciation.

## 2019-07-18 ENCOUNTER — Ambulatory Visit: Payer: BC Managed Care – PPO | Admitting: Physical Therapy

## 2019-07-21 ENCOUNTER — Encounter: Payer: Self-pay | Admitting: Physical Therapy

## 2019-07-21 ENCOUNTER — Other Ambulatory Visit: Payer: Self-pay

## 2019-07-21 ENCOUNTER — Ambulatory Visit: Payer: BC Managed Care – PPO | Attending: Neurology | Admitting: Physical Therapy

## 2019-07-21 DIAGNOSIS — M542 Cervicalgia: Secondary | ICD-10-CM | POA: Insufficient documentation

## 2019-07-21 DIAGNOSIS — M546 Pain in thoracic spine: Secondary | ICD-10-CM | POA: Diagnosis not present

## 2019-07-21 DIAGNOSIS — R293 Abnormal posture: Secondary | ICD-10-CM | POA: Insufficient documentation

## 2019-07-21 DIAGNOSIS — R2689 Other abnormalities of gait and mobility: Secondary | ICD-10-CM

## 2019-07-21 NOTE — Therapy (Signed)
[facility] [address], [place], [place], [address] Phone: [phone]   Fax:  [phone]  Physical Therapy Treatment  Patient Details  Name: Brenda Wilson MRN: [id number] Date of Birth: 1971-03-30 Referring Provider (PT): Melvenia Beam, MD   Encounter Date: 07/21/2019  PT End of Session - 07/21/19 0756    Visit Number  6    Date for PT Re-Evaluation  08/06/19    Authorization Type  BCBS    PT Start Time  0757    PT Stop Time  0840    PT Time Calculation (min)  43 min    Activity Tolerance  Patient tolerated treatment well;No increased pain    Behavior During Therapy  WFL for tasks assessed/performed       Past Medical History:  Diagnosis Date  . Anxiety   . DDD (degenerative disc disease)   . GERD (gastroesophageal reflux disease)     Past Surgical History:  Procedure Laterality Date  . SPINE SURGERY  2007   L4-L5  . TUBAL LIGATION      There were no vitals filed for this visit.  Subjective Assessment - 07/21/19 0757    Subjective  Pt reports less overall pain.  Less tenderness throughout neck.  Arms still feel heavy and some pain to raise and lift them.  My brain and neck MRI came back and just found some arthritis.  I see neurologist 07/31/19 to go over it.    Limitations  Sitting;Standing;Walking;House hold activities;Lifting    How long can you sit comfortably?  1 hour    How long can you stand comfortably?  1 hour    How long can you walk comfortably?  2-3 min, limited more by cardiovascular    Diagnostic tests  MRI of cspine and brain ordered    Patient Stated Goals  relieve muscle pains    Pain Score  2     Pain Location  Back    Pain Orientation  Right;Left;Mid;Upper    Pain Descriptors / Indicators  Tightness    Pain Type  Chronic pain    Pain Radiating Towards  chest    Pain Onset  More than a month ago    Pain Frequency  Intermittent    Pain Relieving Factors  sleep                        [facility] Adult PT Treatment/Exercise - 07/21/19 0001      Exercises   Exercises  Neck      Neck Exercises: Machines for Strengthening   UBE (Upper Arm Bike)  L1 2x2 fwd/bwd, PT present to discuss symptoms      Neck Exercises: Seated   Other Seated Exercise  seated thoracic rot x 10 sec holds 3x bil with PT cue to breathe into abdomen for diaphragm expansion    Other Seated Exercise  levator and upper trap stretch bil 2x20 sec each      Shoulder Exercises: Sidelying   Other Sidelying Exercises  open books 3x 3 breath cycle holds each side      Shoulder Exercises: Standing   Row  Strengthening;Both;10 reps;Theraband    Theraband Level (Shoulder Row)  Level 4 (Blue)    Row Limitations  high to low row (band over top of doorframe, PT cued exhale on row)      Shoulder Exercises: ROM/Strengthening   Other ROM/Strengthening Exercises  seated thoracic ext over back of  chair with pec stretch 3x5 sec   with PT cueing diaphragmatic breath     Manual Therapy   Manual Therapy  Soft tissue mobilization;Myofascial release    Myofascial Release  pectorals bil, abdominal diaphragm, thoracic paraspinals bil               PT Short Term Goals - 07/21/19 0840      PT SHORT TERM GOAL #1   Title  Pt will be ind in initial HEP for stretching and ROM    Status  Achieved      PT SHORT TERM GOAL #2   Title  Pt will report at least 20% reduction in stiffness in transfers and with nighttime bed mobility.    Status  Achieved        PT Long Term Goals - 06/25/19 0916      PT LONG TERM GOAL #1   Title  Pt will be ind in advanced HEP to maximize mobility and cardiovascular health.    Time  6    Period  Weeks    Status  New    Target Date  08/06/19      PT LONG TERM GOAL #2   Title  Pt will report at least 50% improvement in cervical and thoracic pain throughout daily tasks.    Time  6    Period  Weeks    Status  New    Target Date  08/06/19      PT  LONG TERM GOAL #3   Title  Pt will achieve LE flexibility to [facility] to ease mobility for bending, squatting, and gait.    Time  6    Period  Weeks    Status  New    Target Date  08/06/19      PT LONG TERM GOAL #4   Title  Pt will be able to demo full ribcage expansion 3-dimensionally without pain to improve mobility for cardiovascular endurance.    Time  6    Period  Weeks    Status  New    Target Date  08/06/19      PT LONG TERM GOAL #5   Title  Pt will achieve at least 65 deg cervical rotation bil to improve driving visibility.    Time  6    Period  Weeks    Status  New    Target Date  08/06/19            Plan - 07/21/19 0841    Clinical Impression Statement  Pt reports signif reduction in neck tenderness and overall pain.  She has met STGs.  Session today focused on myofascial release of pectorals and thoracic region including diaphragm.  PT continues to encourage stretching and using targeted breath for futher stretch and expansion of thoracic region and diaphragm.  Pt is very pleased with progress and is compliant with HEP.  She reported greater ease of motion and breathing end of session today.  She will continue to benefit from manual techniques, stretching, breathwork and postural strength to reduce pain and improve overall quality of life.    Comorbidities  + UMN clonus bil ankles, Hx of lumbar laminectomy L4/5    Rehab Potential  Good    PT Frequency  2x / week    PT Duration  6 weeks    PT Treatment/Interventions  ADLs/Self Care Home Management;Cryotherapy;Electrical Stimulation;Moist Heat;Gait training;Stair training;Functional mobility training;Therapeutic activities;Therapeutic exercise;Balance training;Neuromuscular re-education;Manual techniques;Patient/family education;Passive range of motion;Dry needling;Spinal Manipulations;Joint Manipulations;Taping  PT Next Visit Plan  continue stretching, breath cueing, ROM, postural strength (add high to low row to HEP),  myofascial release, dry needling as needed    PT Home Exercise Plan  [facility]    Consulted and Agree with Plan of Care  Patient       Patient will benefit from skilled therapeutic intervention in order to improve the following deficits and impairments:     Visit Diagnosis: Cervicalgia  Pain in thoracic spine  Abnormal posture  Other abnormalities of gait and mobility     Problem List Patient Active Problem List   Diagnosis Date Noted  . Anxiety 09/19/2018  . Costochondral chest pain 09/19/2018  . Genetic carrier of other disease 09/21/2017  . Prediabetes 09/21/2017  . Myalgia 09/21/2017    Baruch Merl, PT 07/21/19 8:45 AM   [facility] [address] [place], [place], [address] Phone: [phone]   Fax:  [phone]  Name: Brenda Wilson MRN: [id number] Date of Birth: 1970/11/21

## 2019-07-24 ENCOUNTER — Ambulatory Visit: Payer: BC Managed Care – PPO | Admitting: Physical Therapy

## 2019-07-24 ENCOUNTER — Encounter: Payer: Self-pay | Admitting: Physical Therapy

## 2019-07-24 ENCOUNTER — Other Ambulatory Visit: Payer: Self-pay

## 2019-07-24 DIAGNOSIS — R293 Abnormal posture: Secondary | ICD-10-CM | POA: Diagnosis not present

## 2019-07-24 DIAGNOSIS — M546 Pain in thoracic spine: Secondary | ICD-10-CM | POA: Diagnosis not present

## 2019-07-24 DIAGNOSIS — M542 Cervicalgia: Secondary | ICD-10-CM | POA: Diagnosis not present

## 2019-07-24 DIAGNOSIS — R2689 Other abnormalities of gait and mobility: Secondary | ICD-10-CM | POA: Diagnosis not present

## 2019-07-24 NOTE — Therapy (Addendum)
[facility] [address], [place], [place], [address] Phone: [phone]   Fax:  [phone]  Physical Therapy Treatment  Patient Details  Name: Brenda Wilson MRN: [id number] Date of Birth: [birth year] Referring Provider (PT): Melvenia Beam, MD   Encounter Date: 07/24/2019  PT End of Session - 07/24/19 0814    Visit Number  7    Date for PT Re-Evaluation  08/06/19    Authorization Type  BCBS    PT Start Time  1660    PT Stop Time  0835    PT Time Calculation (min)  42 min    Activity Tolerance  Patient tolerated treatment well;No increased pain    Behavior During Therapy  WFL for tasks assessed/performed       Past Medical History:  Diagnosis Date  . Anxiety   . DDD (degenerative disc disease)   . GERD (gastroesophageal reflux disease)     Past Surgical History:  Procedure Laterality Date  . SPINE SURGERY  2007   L4-L5  . TUBAL LIGATION      There were no vitals filed for this visit.  Subjective Assessment - 07/24/19 0755    Subjective  Pt states that her chest is more sore today. She is not sure what she needs to do now moving forward about her pain.    Limitations  Sitting;Standing;Walking;House hold activities;Lifting    How long can you sit comfortably?  1 hour    How long can you stand comfortably?  1 hour    How long can you walk comfortably?  2-3 min, limited more by cardiovascular    Diagnostic tests  MRI of cspine and brain ordered    Patient Stated Goals  relieve muscle pains    Currently in Pain?  Yes    Pain Score  7     Pain Location  Thoracic    Pain Orientation  Right;Left;Posterior;Lateral    Pain Descriptors / Indicators  Dull;Aching    Pain Type  Acute pain;Chronic pain    Pain Radiating Towards  none    Pain Onset  More than a month ago    Pain Frequency  Intermittent    Aggravating Factors   deep inhale    Pain Relieving Factors  relaxed position                        [facility] Adult PT Treatment/Exercise - 07/24/19 0001      Exercises   Other Exercises   quadruped cat/cow x10 reps (limited flexion noted)      Neck Exercises: Machines for Strengthening   UBE (Upper Arm Bike)  L1.5 x2 min forward/backward PT present to discuss progress       Neck Exercises: Seated   Other Seated Exercise  rib expansion with yellow TB around chest to cue for proper technique x10 reps (+) shoulder shrug noted      Neck Exercises: Supine   Other Supine Exercise  horizontal abduction with yellow TB x10 reps       Shoulder Exercises: Stretch   Other Shoulder Stretches  Rt and Lt lat stretch in doorway with rib expansion to one side x10 reps       Manual Therapy   Joint Mobilization  Lt and Rt rib mobilization with movement during thoracic rotation x10 reps each direction     Myofascial Release  Lt pectorals with passive stretch, teres/latissimus STM on Lt (heavily guarded)  PT Education - 07/24/19 0848    Education Details  technique with therex    Person(s) Educated  Patient    Methods  Explanation;Verbal cues;Demonstration;Tactile cues    Comprehension  Verbalized understanding;Tactile cues required       PT Short Term Goals - 07/21/19 0840      PT SHORT TERM GOAL #1   Title  Pt will be ind in initial HEP for stretching and ROM    Status  Achieved      PT SHORT TERM GOAL #2   Title  Pt will report at least 20% reduction in stiffness in transfers and with nighttime bed mobility.    Status  Achieved        PT Long Term Goals - 06/25/19 0916      PT LONG TERM GOAL #1   Title  Pt will be ind in advanced HEP to maximize mobility and cardiovascular health.    Time  6    Period  Weeks    Status  New    Target Date  08/06/19      PT LONG TERM GOAL #2   Title  Pt will report at least 50% improvement in cervical and thoracic pain throughout daily tasks.    Time  6    Period  Weeks    Status  New     Target Date  08/06/19      PT LONG TERM GOAL #3   Title  Pt will achieve LE flexibility to [facility] to ease mobility for bending, squatting, and gait.    Time  6    Period  Weeks    Status  New    Target Date  08/06/19      PT LONG TERM GOAL #4   Title  Pt will be able to demo full ribcage expansion 3-dimensionally without pain to improve mobility for cardiovascular endurance.    Time  6    Period  Weeks    Status  New    Target Date  08/06/19      PT LONG TERM GOAL #5   Title  Pt will achieve at least 65 deg cervical rotation bil to improve driving visibility.    Time  6    Period  Weeks    Status  New    Target Date  08/06/19            Plan - 07/24/19 0848    Clinical Impression Statement  Pt arrived with increased soreness in the lateral thoracic region/rib cage. Pt continues to have difficulty with rib expansion, requiring assistance from her accessory muscles. Session focused on therex to increase awareness of posture and rib expansion. Pt will require further instruction on this. PT completed soft tissue mobilization techniques to the pectorals. Pt was also tender with palpation of the lats/teres region of the posterior shoulder. She was given instruction for self-release techniques to address this more at home.    Comorbidities  + UMN clonus bil ankles, Hx of lumbar laminectomy L4/5    Rehab Potential  Good    PT Frequency  2x / week    PT Duration  6 weeks    PT Treatment/Interventions  ADLs/Self Care Home Management;Cryotherapy;Electrical Stimulation;Moist Heat;Gait training;Stair training;Functional mobility training;Therapeutic activities;Therapeutic exercise;Balance training;Neuromuscular re-education;Manual techniques;Patient/family education;Passive range of motion;Dry needling;Spinal Manipulations;Joint Manipulations;Taping    PT Next Visit Plan  d/n lats/teres, breath cueing, ROM, postural strength (add high to low row to HEP), myofascial release, dry needling as  needed    PT Home Exercise Plan  [email or web address]    Consulted and Agree with Plan of Care  Patient       Patient will benefit from skilled therapeutic intervention in order to improve the following deficits and impairments:     Visit Diagnosis: Cervicalgia  Pain in thoracic spine  Abnormal posture  Other abnormalities of gait and mobility     Problem List Patient Active Problem List   Diagnosis Date Noted  . Anxiety 09/19/2018  . Costochondral chest pain 09/19/2018  . Genetic carrier of other disease 09/21/2017  . Prediabetes 09/21/2017  . Myalgia 09/21/2017    8:57 AM,07/24/19 Sherol Dade PT, DPT [facility]   PHYSICAL THERAPY DISCHARGE SUMMARY  Visits from Start of Care: 7  Current functional level related to goals / functional outcomes: See above   Remaining deficits: Pt with ongoing stiffness but is well managed with HEP including stretching, spinal ROM and targeting breathing exercises.   Education / Equipment: HEP Plan: Patient agrees to discharge.  Patient goals were partially met. Patient is being discharged due to being pleased with the current functional level.  ?????         Baruch Merl, PT 08/01/19 7:48 AM   [facility] [address], [place], [place], [address] Phone: [phone]   Fax:  [phone]  Name: JORDANNE ELSBURY MRN: [id number] Date of Birth: 11-Apr-1971

## 2019-07-24 NOTE — Patient Instructions (Signed)
Access Code: [email or web address]  URL: [email or web address]  Date: 07/24/2019  Prepared by: Donita Brooks   Exercises  Chest Mobilization with Gar Ponto - 10 reps - 3 sets - 1x daily - 7x weekly    [facility] [address] [place], [place] [address] Phone # [phone] Fax [phone]

## 2019-07-29 ENCOUNTER — Ambulatory Visit: Payer: BC Managed Care – PPO | Admitting: Physical Therapy

## 2019-07-31 ENCOUNTER — Ambulatory Visit (INDEPENDENT_AMBULATORY_CARE_PROVIDER_SITE_OTHER): Payer: BC Managed Care – PPO | Admitting: Neurology

## 2019-07-31 ENCOUNTER — Other Ambulatory Visit: Payer: Self-pay

## 2019-07-31 DIAGNOSIS — R200 Anesthesia of skin: Secondary | ICD-10-CM

## 2019-07-31 DIAGNOSIS — M791 Myalgia, unspecified site: Secondary | ICD-10-CM

## 2019-07-31 DIAGNOSIS — R258 Other abnormal involuntary movements: Secondary | ICD-10-CM

## 2019-07-31 DIAGNOSIS — E71529 X-linked adrenoleukodystrophy, unspecified type: Secondary | ICD-10-CM

## 2019-07-31 DIAGNOSIS — Z0289 Encounter for other administrative examinations: Secondary | ICD-10-CM

## 2019-07-31 DIAGNOSIS — R292 Abnormal reflex: Secondary | ICD-10-CM

## 2019-07-31 NOTE — Progress Notes (Signed)
Patient is here for EMG nerve conduction study for generalized muscle pain.  I reviewed results with patient, EMG nerve conduction study was completely normal, no nerve disease or muscle disease.  Patient reports that she is having some muscle twitching in the legs, she has chronic muscle pain throughout the body more so in the upper torso cervical area of the arms, the chest and entire thorax but she also has similar symptoms in the legs where they feel heavy.  MRI of the brain and cervical spine showed no etiology for this.  Extensive lab testing including B12, folate, methylmalonic acid, myasthenia gravis panel, Lyme antibodies, anti-Vanbenschoten antibodies, marking on spotted fever, rheumatoid arthritis, CRP, ANA, ESR, vitamin D, magnesium, CK, aldolase, magnesium, TSH all normal and in the past she has had multiple other tests including hepatitis panel, hemoglobin A1c.  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  I did send her for dry needling which she said helped as did the stretching exercises.  At this point I will release her back to her primary care physician Dr. Melford Aase.  I did talk to her about being sent to [facility] or [facility] to see if there is an ALD clinic, at this point she would like to talk to her primary care about pain management and if she is interested she will let him know when he can refer her.  A total of 15 minutes was spent on this patient's care, reviewing imaging, past records, recent hospitalization notes and results. Over half this time was spent on counseling patient on the  1. Muscle pain    diagnosis and different diagnostic and therapeutic options, counseling and coordination of care, risks and benefitsof management, compliance, or risk factor reduction and education.  This does not include  time spent on emg/ncs.

## 2019-07-31 NOTE — Progress Notes (Signed)
See procedure note.

## 2019-08-01 ENCOUNTER — Ambulatory Visit: Payer: BC Managed Care – PPO | Admitting: Physical Therapy

## 2019-08-04 NOTE — Progress Notes (Signed)
      Full Name: Brenda Wilson Gender: Female MRN #: [id number] Date of Birth: 11/05/1970    Visit Date: 07/31/2019 08:31 Age: 48 Years Examining Physician: Taylin Leder, MD  Height: 5 feet 4 inch  History: Patient with generalized muscle pain  Summary: EMG/NCS performed on the left lower and left upper extremities.    Conclusion: This is a normal study. No suggestion of mononeuropathy, polyneuropathy, radiculopathy or muscle disease.    Johanthan Kneeland M.D.  [facility] [address] [place], [place] [address] Tel: [phone] Fax: [phone]         MNC    Nerve / Sites Muscle Latency Ref. Amplitude Ref. Rel Amp Segments Distance Velocity Ref. Area    ms ms mV mV %  cm m/s m/s mVms  L Median - APB     Wrist APB 2.9 ?4.4 9.7 ?4.0 100 Wrist - APB 7   37.6     Upper arm APB 6.5  8.5  87.9 Upper arm - Wrist 20 55 ?49 30.9  L Ulnar - ADM     Wrist ADM 2.6 ?3.3 12.9 ?6.0 100 Wrist - ADM 7   36.5     B.Elbow ADM 5.5  12.5  96.6 B.Elbow - Wrist 20 71 ?49 37.0     A.Elbow ADM 6.9  12.1  97 A.Elbow - B.Elbow 10 70 ?49 36.7         A.Elbow - Wrist      L Peroneal - EDB     Ankle EDB 4.6 ?6.5 5.4 ?2.0 100 Ankle - EDB 9   20.0     Fib head EDB 10.2  4.9  91.4 Fib head - Ankle 27 48 ?44 18.8     Pop fossa EDB 12.3  4.8  97.4 Pop fossa - Fib head 10 49 ?44 19.7         Pop fossa - Ankle      L Tibial - AH     Ankle AH 4.2 ?5.8 8.9 ?4.0 100 Ankle - AH 9   21.4     Pop fossa AH 12.0  6.4  71.8 Pop fossa - Ankle 34 44 ?41 19.8             SNC    Nerve / Sites Rec. Site Peak Lat Ref.  Amp Ref. Segments Distance    ms ms V V  cm  L Sural - Ankle (Calf)     Calf Ankle 3.5 ?4.4 16 ?6 Calf - Ankle 14  L Superficial peroneal - Ankle     Lat leg Ankle 3.5 ?4.4 11 ?6 Lat leg - Ankle 14  L Median - Orthodromic (Dig II, Mid palm)     Dig II Wrist 2.5 ?3.4 24 ?10 Dig II - Wrist 13  L Ulnar - Orthodromic, (Dig V, Mid palm)     Dig V Wrist 2.6 ?3.1 15 ?5 Dig V - Wrist  11             F  Wave    Nerve F Lat Ref.   ms ms  L Tibial - AH 48.4 ?56.0  L Ulnar - ADM 24.8 ?32.0         EMG Summary Table    Spontaneous MUAP Recruitment  Muscle IA Fib PSW Fasc Other Amp Dur. Poly Pattern  L. Deltoid Normal None None None _______ Normal Normal Normal Normal  L. Triceps brachii Normal None None None _______ Normal Normal Normal   Normal  L. Pronator teres Normal None None None _______ Normal Normal Normal Normal  L. First dorsal interosseous and ADM Normal None None None _______ Normal Normal Normal Normal  L. Cervical paraspinals (low) Normal None None None _______ Normal Normal Normal Normal  L. Iliopsoas Normal None None None _______ Normal Normal Normal Normal  L. Vastus medialis Normal None None None _______ Normal Normal Normal Normal  L. Gastrocnemius (Medial head) Normal None None None _______ Normal Normal Normal Normal  L. Tibialis anterior Normal None None None _______ Normal Normal Normal Normal  L. Biceps femoris (long head) Normal None None None _______ Normal Normal Normal Normal  L. Gluteus maximus Normal None None None _______ Normal Normal Normal Normal  L. Gluteus medius Normal None None None _______ Normal Normal Normal Normal  L. Lumbar paraspinals (low) Normal None None None _______ Normal Normal Normal Normal

## 2019-08-04 NOTE — Procedures (Signed)
Full Name: Brenda Wilson Gender: Female MRN #: [id number] Date of Birth: 08-26-1970    Visit Date: 07/31/2019 08:31 Age: 48 Years Examining Physician: Sarina Ill, MD  Height: 5 feet 4 inch  History: Patient with generalized muscle pain  Summary: EMG/NCS performed on the left lower and left upper extremities.    Conclusion: This is a normal study. No suggestion of mononeuropathy, polyneuropathy, radiculopathy or muscle disease.    Sarina Ill M.D.  [facility] [place], [place] [address] Tel: [phone] Fax: [phone]         [facility]    Nerve / Sites Muscle Latency Ref. Amplitude Ref. Rel Amp Segments Distance Velocity Ref. Area    ms ms mV mV %  cm m/s m/s mVms  L Median - APB     Wrist APB 2.9 ?4.4 9.7 ?4.0 100 Wrist - APB 7   37.6     Upper arm APB 6.5  8.5  87.9 Upper arm - Wrist 20 55 ?49 30.9  L Ulnar - ADM     Wrist ADM 2.6 ?3.3 12.9 ?6.0 100 Wrist - ADM 7   36.5     B.Elbow ADM 5.5  12.5  96.6 B.Elbow - Wrist 20 71 ?49 37.0     A.Elbow ADM 6.9  12.1  97 A.Elbow - B.Elbow 10 70 ?49 36.7         A.Elbow - Wrist      L Peroneal - EDB     Ankle EDB 4.6 ?6.5 5.4 ?2.0 100 Ankle - EDB 9   20.0     Fib head EDB 10.2  4.9  91.4 Fib head - Ankle 27 48 ?44 18.8     Pop fossa EDB 12.3  4.8  97.4 Pop fossa - Fib head 10 49 ?44 19.7         Pop fossa - Ankle      L Tibial - AH     Ankle AH 4.2 ?5.8 8.9 ?4.0 100 Ankle - AH 9   21.4     Pop fossa AH 12.0  6.4  71.8 Pop fossa - Ankle 34 44 ?41 19.8             SNC    Nerve / Sites Rec. Site Peak Lat Ref.  Amp Ref. Segments Distance    ms ms V V  cm  L Sural - Ankle (Calf)     Calf Ankle 3.5 ?4.4 16 ?6 Calf - Ankle 14  L Superficial peroneal - Ankle     Lat leg Ankle 3.5 ?4.4 11 ?6 Lat leg - Ankle 14  L Median - Orthodromic (Dig II, Mid palm)     Dig II Wrist 2.5 ?3.4 24 ?10 Dig II - Wrist 13  L Ulnar - Orthodromic, (Dig V, Mid palm)     Dig V Wrist 2.6 ?3.1 15 ?5 Dig V - Wrist  15             F  Wave    Nerve F Lat Ref.   ms ms  L Tibial - AH 48.4 ?56.0  L Ulnar - ADM 24.8 ?32.0         EMG Summary Table    Spontaneous MUAP Recruitment  Muscle IA Fib PSW Fasc Other Amp Dur. Poly Pattern  L. Deltoid Normal None None None _______ Normal Normal Normal Normal  L. Triceps brachii Normal None None None _______ Normal Normal Normal  Normal  L. Pronator teres Normal None None None _______ Normal Normal Normal Normal  L. First dorsal interosseous and ADM Normal None None None _______ Normal Normal Normal Normal  L. Cervical paraspinals (low) Normal None None None _______ Normal Normal Normal Normal  L. Iliopsoas Normal None None None _______ Normal Normal Normal Normal  L. Vastus medialis Normal None None None _______ Normal Normal Normal Normal  L. Gastrocnemius (Medial head) Normal None None None _______ Normal Normal Normal Normal  L. Tibialis anterior Normal None None None _______ Normal Normal Normal Normal  L. Biceps femoris (long head) Normal None None None _______ Normal Normal Normal Normal  L. Gluteus maximus Normal None None None _______ Normal Normal Normal Normal  L. Gluteus medius Normal None None None _______ Normal Normal Normal Normal  L. Lumbar paraspinals (low) Normal None None None _______ Normal Normal Normal Normal

## 2019-08-28 ENCOUNTER — Telehealth: Payer: Self-pay

## 2019-08-28 ENCOUNTER — Other Ambulatory Visit: Payer: Self-pay | Admitting: Adult Health

## 2019-08-28 MED ORDER — MELOXICAM 15 MG PO TABS: ORAL_TABLET | ORAL | 0 refills | Status: DC

## 2019-08-28 MED ORDER — GABAPENTIN 300 MG PO CAPS: 300.0000 mg | ORAL_CAPSULE | Freq: Every day | ORAL | 2 refills | Status: DC

## 2019-08-28 NOTE — Telephone Encounter (Signed)
Patient is requesting a prescription for Ibuprofen for muscle pain. Please advise

## 2019-08-30 ENCOUNTER — Other Ambulatory Visit: Payer: Self-pay | Admitting: Adult Health

## 2019-10-04 ENCOUNTER — Other Ambulatory Visit: Payer: Self-pay | Admitting: Adult Health

## 2019-10-06 NOTE — Progress Notes (Signed)
3 Month Follow Up   Assessment and Plan:   Brenda Wilson was seen today for acute visit.  Diagnoses and all orders for this visit:  ALD (adrenoleukodystrophy) (HCC) MRI - Negative for acute abnormalities EMG/NCS - no abnormalities -     CBC with Differential/Platelet -     COMPLETE METABOLIC PANEL WITH GFR -     TSH -     Lipid panel  Muscle stiffness/rigidity Myalgias -     Myasthenia gravis panel 2 -     meloxicam (MOBIC) 15 MG tablet; Take 1/2 to 1 tablet Daily with Food for Pain & Inflammation & try Limit to 5 days /week to Avoid Kidney Damage -     Anti-Howk antibody   Vitamin D deficiency Continue supplementation Taking Multivitamin for this  Anxiety Doing well on current regiment Continue: alprazolam 0.25mg  PRN using 4 days or week or less. Discussed stress management techniques   Discussed good sleep hygiene Discussed increasing physical activity and exercise Increase water intake  B12 deficiency -     Vitamin B12  Tick bite, initial encounter -     B. burgdorfi antibodies     Continue diet and meds as discussed. Further disposition pending results of labs. Discussed med's effects and SE's.  Patient agrees with plan of care and opportunity to ask questions/voice concerns. Over 30 minutes of chart review, face to face interview, exam, counseling, and critical decision making was performed.   Future Appointments  Date Time Provider Department Center  10/13/2019  4:00 PM [facility] None  12/03/2019  9:00 AM Judd Gaudier, NP [facility] None    ----------------------------------------------------------------------------------------------------------------------  HPI 49 y.o. female  presents for 3 month follow up on HTN, HLD, abnormal glucose with history of pre-diabetes, weight and vitamin D deficiency.    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  She is managing and taking  meloxicam prn.  She completed previous round of prednisone but reports adverse side effects and does not want to take again though this did help her symptoms.   She continues to reports generalized myalgias and reports pain with deep breath.  She continues to have difficulty sleeping at night and turning over in bed is very painful.  She reports the gabapentin 300mg , two tablets at bedtime is helping with her sleep since last OV.  She is also taking 2 tart cherry supplements for inflmmation. She has taken flexeril and this has had not effect on improvement of her symptoms.  She has also tried baclofen.  She is now following with Neurology, Dr .  She is going to physical therapy for this.  She had EMG studies on 07/31/19 which were unremarkable.  She has had needling in the past that works temporarily but no long lasting effects.  She does not wish to try this again related to benefit vs cost.   BMI is Body mass index is 25.58 kg/m., she has not been working on diet and exercise. Wt Readings from Last 3 Encounters:  10/07/19 149 lb (67.6 kg)  06/16/19 149 lb 8 oz (67.8 kg)  03/25/19 144 lb (65.3 kg)      Her blood pressure has been controlled at home, today their BP is BP: 120/80  She does not workout though she is up moving and on her feet for the duration of her shift at work.. She denies any cardiac symptoms, chest pains, palpitations, shortness of breath, dizziness  or lower extremity edema.      She is not on cholesterol medication and denies myalgias.   Her cholesterol is not at goal. The cholesterol last visit was:   Lab Results  Component Value Date   CHOL 159 10/08/2014   HDL 58 10/08/2014   LDLCALC 83 10/08/2014   TRIG 91 10/08/2014   CHOLHDL 2.7 10/08/2014    She has not been working on diet and exercise for prediabetes, and denies nausea, paresthesia of the feet, polydipsia, polyuria, visual disturbances, vomiting and weight loss. Last A1C in the office was:  Lab  Results  Component Value Date   HGBA1C 5.4 07/30/2013     Patient does not have history of CKD.  Their last GFR was:  Lab Results  Component Value Date   GFRNONAA 80 06/16/2019   Lab Results  Component Value Date   GFRAA 92 06/16/2019     Patient is on Vitamin D supplement for deficiency. Lab Results  Component Value Date   VD25OH 26 (L) 03/25/2019        Current Medications:  Current Outpatient Medications on File Prior to Visit  Medication Sig  . acyclovir (ZOVIRAX) 800 MG tablet TAKE ONE TABLET BY MOUTH DAILY AS NEEDED  . ALPRAZolam (XANAX) 0.25 MG tablet Take 1-2 tabs (0.25mg -0.50mg ) 30-60 minutes before procedure. May repeat if needed.Do not drive.  . gabapentin (NEURONTIN) 300 MG capsule Take 1-3 capsules (300-900 mg total) by mouth at bedtime. As needed for sleep and pain.  . Magnesium 250 MG TABS Take 1 tablet by mouth daily.  . Multiple Vitamins-Minerals (MULTIVITAMIN ADULT) CHEW Chew by mouth.   No current facility-administered medications on file prior to visit.    Allergies:  No Known Allergies    Medical History:  Past Medical History:  Diagnosis Date  . Anxiety   . DDD (degenerative disc disease)   . GERD (gastroesophageal reflux disease)      Family history- Reviewed and unchanged Family History  Problem Relation Age of Onset  . Cancer Father   . Melanoma Sister   . Cancer Paternal Uncle   . Melanoma Paternal Uncle   . Cancer Paternal Grandmother      Social history- Reviewed and unchanged Social History   Tobacco Use  . Smoking status: Never Smoker  . Smokeless tobacco: Never Used  Substance Use Topics  . Alcohol use: Yes    Comment: Sometimes  . Drug use: No     Names of Other Physician/Practitioners you currently use: 1. [facility] here for primary care 2. Eye Exam: Due for 2021 3. Dental Exam Due for 2021   Patient Care Team: Lucky Cowboy, MD as PCP - General (Internal  Medicine)    Screening Tests: Immunization History  Administered Date(s) Administered  . Tdap 07/30/2013     Vaccinations: TD or Tdap: 07/2013  Influenza: Due for 2021 Pneumococcal: N/A Prevnar13: N/A Shingles: Zostavax/Shingrix: Discussed with patient   Preventative Care: Last colonoscopy: Due, discussed with patient Last mammogram: Last 2017, due for 2021 Last pap smear/pelvic exam: 2017  DEXA: N/A Hep C screening (1945-1965): N/A HIV - 2018 Negative RPR - 2018 Nonreactive Hep panel 2018 : All negative/non reactive    Review of Systems:  Review of Systems  Constitutional: Negative for chills, diaphoresis, fever, malaise/fatigue and weight loss.  HENT: Negative for congestion, ear discharge, ear pain, hearing loss, nosebleeds, sinus pain, sore throat and tinnitus.   Eyes: Negative for blurred vision, double vision, photophobia,  pain, discharge and redness.  Respiratory: Negative for cough, hemoptysis, sputum production, shortness of breath, wheezing and stridor.   Cardiovascular: Negative for chest pain, palpitations, orthopnea, claudication, leg swelling and PND.  Gastrointestinal: Negative for abdominal pain, blood in stool, constipation, diarrhea, heartburn, melena, nausea and vomiting.  Genitourinary: Negative for dysuria, flank pain, frequency, hematuria and urgency.  Musculoskeletal: Positive for myalgias. Negative for back pain, falls, joint pain and neck pain.       Generalized  Skin: Negative for itching and rash.  Neurological: Negative for dizziness, tingling, tremors, sensory change, speech change, focal weakness, seizures, loss of consciousness, weakness and headaches.  Endo/Heme/Allergies: Negative for environmental allergies and polydipsia. Does not bruise/bleed easily.  Psychiatric/Behavioral: Negative for depression, hallucinations, memory loss, substance abuse and suicidal ideas. The patient is nervous/anxious. The patient does not have insomnia.        Physical Exam: BP 120/80   Pulse 90   Temp (!) 97.5 F (36.4 C)   Wt 149 lb (67.6 kg)   SpO2 99%   BMI 25.58 kg/m  Wt Readings from Last 3 Encounters:  10/07/19 149 lb (67.6 kg)  06/16/19 149 lb 8 oz (67.8 kg)  03/25/19 144 lb (65.3 kg)   General Appearance: Well nourished, in no apparent distress. Eyes: PERRLA, EOMs, conjunctiva no swelling or erythema Sinuses: No Frontal/maxillary tenderness ENT/Mouth: Ext aud canals clear, TMs without erythema, bulging. No erythema, swelling, or exudate on post pharynx.  Tonsils not swollen or erythematous. Hearing normal.  Neck: Supple, thyroid normal.  Respiratory: Respiratory effort normal, BS equal bilaterally without rales, rhonchi, wheezing or stridor.  Cardio: RRR with no MRGs. Brisk peripheral pulses without edema.  Abdomen: Soft, + BS.  Non tender, no guarding, rebound, hernias, masses. Lymphatics: Non tender without lymphadenopathy.  Musculoskeletal: Full ROM, 5/5 strength, Normal gait Skin: Warm, dry without rashes, lesions, ecchymosis.  Neuro: Cranial nerves intact. No cerebellar symptoms. Left foot, toes pointed downward.  Clonus 4 beats, all reflexes hyper. Psych: Awake and oriented X 3, normal affect, Insight and Judgment appropriate.    Garnet Sierras, NP [facility] 10:50 AM

## 2019-10-07 ENCOUNTER — Encounter: Payer: Self-pay | Admitting: Adult Health Nurse Practitioner

## 2019-10-07 ENCOUNTER — Ambulatory Visit (INDEPENDENT_AMBULATORY_CARE_PROVIDER_SITE_OTHER): Payer: BC Managed Care – PPO | Admitting: Adult Health Nurse Practitioner

## 2019-10-07 ENCOUNTER — Other Ambulatory Visit: Payer: Self-pay

## 2019-10-07 VITALS — BP 120/80 | HR 90 | Temp 97.5°F | Wt 149.0 lb

## 2019-10-07 DIAGNOSIS — E538 Deficiency of other specified B group vitamins: Secondary | ICD-10-CM

## 2019-10-07 DIAGNOSIS — M6289 Other specified disorders of muscle: Secondary | ICD-10-CM | POA: Diagnosis not present

## 2019-10-07 DIAGNOSIS — R29898 Other symptoms and signs involving the musculoskeletal system: Secondary | ICD-10-CM | POA: Diagnosis not present

## 2019-10-07 DIAGNOSIS — E71529 X-linked adrenoleukodystrophy, unspecified type: Secondary | ICD-10-CM | POA: Diagnosis not present

## 2019-10-07 DIAGNOSIS — M791 Myalgia, unspecified site: Secondary | ICD-10-CM

## 2019-10-07 DIAGNOSIS — R292 Abnormal reflex: Secondary | ICD-10-CM

## 2019-10-07 DIAGNOSIS — E559 Vitamin D deficiency, unspecified: Secondary | ICD-10-CM

## 2019-10-07 DIAGNOSIS — E782 Mixed hyperlipidemia: Secondary | ICD-10-CM

## 2019-10-07 DIAGNOSIS — W57XXXA Bitten or stung by nonvenomous insect and other nonvenomous arthropods, initial encounter: Secondary | ICD-10-CM

## 2019-10-07 DIAGNOSIS — M62838 Other muscle spasm: Secondary | ICD-10-CM

## 2019-10-07 DIAGNOSIS — F419 Anxiety disorder, unspecified: Secondary | ICD-10-CM | POA: Diagnosis not present

## 2019-10-07 MED ORDER — MELOXICAM 15 MG PO TABS: ORAL_TABLET | ORAL | 0 refills | Status: DC

## 2019-10-07 NOTE — Patient Instructions (Signed)
   Start taking B12, sublingual every day.

## 2019-10-11 LAB — COMPLETE METABOLIC PANEL WITH GFR
AG Ratio: 1.7 (calc) (ref 1.0–2.5)
ALT: 26 U/L (ref 6–29)
AST: 23 U/L (ref 10–35)
Albumin: 4.4 g/dL (ref 3.6–5.1)
Alkaline phosphatase (APISO): 121 U/L (ref 37–153)
BUN: 12 mg/dL (ref 7–25)
CO2: 28 mmol/L (ref 20–32)
Calcium: 9.5 mg/dL (ref 8.6–10.4)
Chloride: 104 mmol/L (ref 98–110)
Creat: 0.68 mg/dL (ref 0.50–1.05)
GFR, Est African American: 118 mL/min/{1.73_m2} (ref >=60)
GFR, Est Non African American: 102 mL/min/{1.73_m2} (ref >=60)
Globulin: 2.6 g/dL (calc) (ref 1.9–3.7)
Glucose, Bld: 102 mg/dL — ABNORMAL HIGH (ref 65–99)
Potassium: 4.1 mmol/L (ref 3.5–5.3)
Sodium: 140 mmol/L (ref 135–146)
Total Bilirubin: 0.4 mg/dL (ref 0.2–1.2)
Total Protein: 7 g/dL (ref 6.1–8.1)

## 2019-10-11 LAB — MYASTHENIA GRAVIS PANEL 2
A CHR BINDING ABS: <0.3 nmol/L
ACHR Blocking Abs: <15 % Inhibition (ref <15)
Acetylchol Modul Ab: 8 % Inhibition

## 2019-10-11 LAB — CBC WITH DIFFERENTIAL/PLATELET
Absolute Monocytes: 378 cells/uL (ref 200–950)
Basophils Absolute: 68 cells/uL (ref 0–200)
Basophils Relative: 1.1 %
Eosinophils Absolute: 87 cells/uL (ref 15–500)
Eosinophils Relative: 1.4 %
HCT: 38.3 % (ref 35.0–45.0)
Hemoglobin: 12.6 g/dL (ref 11.7–15.5)
Lymphs Abs: 1339 cells/uL (ref 850–3900)
MCH: 28.1 pg (ref 27.0–33.0)
MCHC: 32.9 g/dL (ref 32.0–36.0)
MCV: 85.5 fL (ref 80.0–100.0)
MPV: 9.7 fL (ref 7.5–12.5)
Monocytes Relative: 6.1 %
Neutro Abs: 4328 cells/uL (ref 1500–7800)
Neutrophils Relative %: 69.8 %
Platelets: 291 10*3/uL (ref 140–400)
RBC: 4.48 10*6/uL (ref 3.80–5.10)
RDW: 12.9 % (ref 11.0–15.0)
Total Lymphocyte: 21.6 %
WBC: 6.2 10*3/uL (ref 3.8–10.8)

## 2019-10-11 LAB — ANTI-SMITH ANTIBODY: ENA SM Ab Ser-aCnc: <1 AI

## 2019-10-11 LAB — LIPID PANEL
Cholesterol: 217 mg/dL — ABNORMAL HIGH (ref <200)
HDL: 70 mg/dL (ref >=50)
LDL Cholesterol (Calc): 132 mg/dL (calc) — ABNORMAL HIGH
Non-HDL Cholesterol (Calc): 147 mg/dL (calc) — ABNORMAL HIGH (ref <130)
Total CHOL/HDL Ratio: 3.1 (calc) (ref <5.0)
Triglycerides: 62 mg/dL (ref <150)

## 2019-10-11 LAB — TSH: TSH: 1.11 mIU/L

## 2019-10-11 LAB — VITAMIN B12: Vitamin B-12: 366 pg/mL (ref 200–1100)

## 2019-10-11 LAB — B. BURGDORFI ANTIBODIES: B burgdorferi Ab IgG+IgM: <0.9 index

## 2019-10-13 ENCOUNTER — Other Ambulatory Visit: Payer: BC Managed Care – PPO

## 2019-11-19 ENCOUNTER — Other Ambulatory Visit: Payer: Self-pay | Admitting: Adult Health

## 2019-12-03 ENCOUNTER — Encounter: Payer: BC Managed Care – PPO | Admitting: Adult Health

## 2020-02-03 DIAGNOSIS — Z20822 Contact with and (suspected) exposure to covid-19: Secondary | ICD-10-CM | POA: Diagnosis not present

## 2020-03-04 ENCOUNTER — Other Ambulatory Visit: Payer: Self-pay

## 2020-03-04 ENCOUNTER — Ambulatory Visit: Payer: BC Managed Care – PPO | Admitting: Adult Health

## 2020-03-04 ENCOUNTER — Encounter: Payer: Self-pay | Admitting: Adult Health

## 2020-03-04 VITALS — BP 122/74 | HR 74 | Temp 97.7°F | Ht 64.0 in | Wt 140.0 lb

## 2020-03-04 DIAGNOSIS — Z1329 Encounter for screening for other suspected endocrine disorder: Secondary | ICD-10-CM

## 2020-03-04 DIAGNOSIS — Z0000 Encounter for general adult medical examination without abnormal findings: Secondary | ICD-10-CM

## 2020-03-04 DIAGNOSIS — R7303 Prediabetes: Secondary | ICD-10-CM

## 2020-03-04 DIAGNOSIS — Z13220 Encounter for screening for lipoid disorders: Secondary | ICD-10-CM | POA: Diagnosis not present

## 2020-03-04 DIAGNOSIS — Z1211 Encounter for screening for malignant neoplasm of colon: Secondary | ICD-10-CM

## 2020-03-04 DIAGNOSIS — R030 Elevated blood-pressure reading, without diagnosis of hypertension: Secondary | ICD-10-CM

## 2020-03-04 DIAGNOSIS — Z136 Encounter for screening for cardiovascular disorders: Secondary | ICD-10-CM

## 2020-03-04 DIAGNOSIS — Z130 Encounter for screening for diseases of the blood and blood-forming organs and certain disorders involving the immune mechanism: Secondary | ICD-10-CM | POA: Diagnosis not present

## 2020-03-04 DIAGNOSIS — E782 Mixed hyperlipidemia: Secondary | ICD-10-CM

## 2020-03-04 DIAGNOSIS — Z131 Encounter for screening for diabetes mellitus: Secondary | ICD-10-CM

## 2020-03-04 DIAGNOSIS — Z148 Genetic carrier of other disease: Secondary | ICD-10-CM

## 2020-03-04 DIAGNOSIS — F419 Anxiety disorder, unspecified: Secondary | ICD-10-CM

## 2020-03-04 DIAGNOSIS — Z79899 Other long term (current) drug therapy: Secondary | ICD-10-CM | POA: Diagnosis not present

## 2020-03-04 DIAGNOSIS — E559 Vitamin D deficiency, unspecified: Secondary | ICD-10-CM

## 2020-03-04 DIAGNOSIS — M62838 Other muscle spasm: Secondary | ICD-10-CM

## 2020-03-04 DIAGNOSIS — Z1389 Encounter for screening for other disorder: Secondary | ICD-10-CM

## 2020-03-04 DIAGNOSIS — G609 Hereditary and idiopathic neuropathy, unspecified: Secondary | ICD-10-CM | POA: Insufficient documentation

## 2020-03-04 DIAGNOSIS — Z800 Family history of malignant neoplasm of digestive organs: Secondary | ICD-10-CM

## 2020-03-04 DIAGNOSIS — M791 Myalgia, unspecified site: Secondary | ICD-10-CM

## 2020-03-04 DIAGNOSIS — R110 Nausea: Secondary | ICD-10-CM

## 2020-03-04 DIAGNOSIS — E538 Deficiency of other specified B group vitamins: Secondary | ICD-10-CM

## 2020-03-04 MED ORDER — ALPRAZOLAM 0.25 MG PO TABS: ORAL_TABLET | ORAL | 0 refills | Status: DC

## 2020-03-04 MED ORDER — GABAPENTIN 300 MG PO CAPS: ORAL_CAPSULE | ORAL | 2 refills | Status: DC

## 2020-03-04 MED ORDER — ONDANSETRON HCL 4 MG PO TABS: 4.0000 mg | ORAL_TABLET | Freq: Three times a day (TID) | ORAL | 0 refills | Status: AC | PRN

## 2020-03-04 MED ORDER — BACLOFEN 10 MG PO TABS: 10.0000 mg | ORAL_TABLET | Freq: Three times a day (TID) | ORAL | 1 refills | Status: DC

## 2020-03-04 NOTE — Progress Notes (Signed)
Complete Physical  Assessment and Plan:  Routine general medical examination at a health care facility Get [facility] 3D, schedule follow up with GYN  Anxiety Rare xanax PRN  stress management techniques discussed, increase water, good sleep hygiene discussed, increase exercise, and increase veggies.  -      TSH  Screening diabetes -     Hemoglobin A1c  Mixed hyperlipidemia Consider med if LDL trending 130+  Continue low cholesterol diet and exercise.  Check lipid panel. -     Lipid panel -     TSH  Screening for cardiovascular condition -     EKG 12-Lead  B12 deficiency -     Vitamin B12  Vitamin D deficiency -     VITAMIN D 25 Hydroxy (Vit-D Deficiency, Fractures)  Screening for thyroid disorder -     TSH  Screening for hematuria or proteinuria -     Urinalysis, Routine w reflex microscopic  Screening for colon cancer -     Ambulatory referral to Gastroenterology  Nausea/family history of pancreatic cancer She is very concerned about possible pancreatic cancer; check labs; consider abd [place]; otherwise exam normal, non-tender; may also discuss with GI -     CBC, CMP/GFR -     Lipase -     ondansetron (ZOFRAN) 4 MG tablet; Take 1 tablet (4 mg total) by mouth every 8 (eight) hours as needed for nausea or vomiting.  Hereditary peripheral neuropathy (r/t ALD) Myalgia Muscle spasticity Very limiting; extended discussion; she is interested in speaking with a specialist to help guide management of sx; likely will need to go to [facility]; referral placed Lifestyle discussed;  -     Ambulatory referral to Neurology -     baclofen (LIORESAL) 10 MG tablet; Take 1 tablet (10 mg total) by mouth 3 (three) times daily. -     gabapentin (NEURONTIN) 300 MG capsule; TAKE 1-3 CAPSULES BY MOUTH AT BEDTIME AS NEEDED FOR SLEEP AND PAIN -     Lipase -     ALPRAZolam (XANAX) 0.25 MG tablet; Take 1 tab up to 3 times a day as needed for severe anxiety. Limit to <5 days/week to avoid addiction.  Do not drive or take with alcohol.  Orders Placed This Encounter  Procedures   CBC with Differential/Platelet   COMPLETE METABOLIC PANEL WITH GFR   Magnesium   Lipid panel   TSH   Hemoglobin A1c   VITAMIN D 25 Hydroxy (Vit-D Deficiency, Fractures)   Urinalysis, Routine w reflex microscopic   Vitamin B12   Lipase   Lipase   Ambulatory referral to Gastroenterology   Ambulatory referral to Neurology   EKG 12-Lead    Discussed med's effects and SE's. Screening labs and tests as requested with regular follow-up as recommended. Over 40 minutes of exam, counseling, chart review, and critical decision making was performed this visit.   Future Appointments  Date Time Provider Department Center  09/06/2020  8:45 AM Judd Gaudier, NP [facility] None  03/08/2021  9:00 AM Judd Gaudier, NP [facility] None     HPI  49 y.o. female  presents for a complete physical. She has Genetic carrier of other disease; Prediabetes; and Anxiety on their problem list.  She is married, 1 daughter.   She follows with [facility], last in ? 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 She  started developing myalgias/weakness/stiffness around 2019 with extensive workups that were unremarkable, was referred to Dr. Lucia GaskinsAhern. She had EMG studies on 07/31/19 which were unremarkable and was advised sx r/t ALD. She was going to physical therapy for this, dry needling but with limited temporary benefit and stopped going due to cost. She takes gabapentin at night for sleep and pain; meloxicam but with worsening sx. 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   She is struggling with pain and muscle cramping that she reports is severe; requesting muscle  relaxer. Also would like to see ALD specialist.   She is concerned about new persistent nausea for 3 weeks. Anxious due to father pancreatic cancer history. She reports will wake up feeling nauseous, not every day but many, not triggered by food, but will last all day. No pain. Has noted intermittent fatty stools. She has been smoking marijuana daily which helps.   BMI is Body mass index is 24.03 kg/m., she has not been working on diet and exercise. Wt Readings from Last 3 Encounters:  03/04/20 140 lb (63.5 kg)  10/07/19 149 lb (67.6 kg)  06/16/19 149 lb 8 oz (67.8 kg)   Her blood pressure has been controlled at home, today their BP is BP: 122/74 She does not workout. She denies chest pain, shortness of breath, dizziness.   She is not on cholesterol medication and denies myalgias. Her cholesterol is at goal. The cholesterol last visit was:   Lab Results  Component Value Date   CHOL 217 (H) 10/07/2019   HDL 70 10/07/2019   LDLCALC 132 (H) 10/07/2019   TRIG 62 10/07/2019   CHOLHDL 3.1 10/07/2019   Last Z6XA1C in the office was:  Lab Results  Component Value Date   HGBA1C 5.4 07/30/2013  She has been on xanax for anxiety since her nephew passed when he was 7 but she has run out, she was just using it for sleep, she has been using melatonin but she will wake up a few hours later. She would not like to get a refill of xanax.   Patient is on Vitamin D supplement.   Lab Results  Component Value Date   VD25OH 26 (L) 03/25/2019        Current Medications:  Current Outpatient Medications on File Prior to Visit  Medication Sig Dispense Refill   acyclovir (ZOVIRAX) 800 MG tablet TAKE ONE TABLET BY MOUTH DAILY AS NEEDED 90 tablet 2   ALPRAZolam (XANAX) 0.25 MG tablet Take 1-2 tabs (0.25mg -0.50mg ) 30-60 minutes before procedure. May repeat if needed.Do not drive. 4 tablet 0   gabapentin (NEURONTIN) 300 MG capsule TAKE 1-3 CAPSULES BY MOUTH AT BEDTIME AS NEEDED FOR SLEEP AND PAIN 90  capsule 2   Magnesium 250 MG TABS Take 1 tablet by mouth daily.     meloxicam (MOBIC) 15 MG tablet Take 1/2 to 1 tablet Daily with Food for Pain & Inflammation & try Limit to 5 days /week to Avoid Kidney Damage 90 tablet 0   Multiple Vitamins-Minerals (MULTIVITAMIN ADULT) CHEW Chew by mouth.     No current facility-administered medications on file prior to visit.   Health Maintenance:   Immunization History  Administered Date(s) Administered   PFIZER SARS-COV-2 Vaccination 01/07/2020   Tdap 07/30/2013   Tetanus: 2015 Pneumovax: N/A Prevnar 13: due 63 Flu vaccine: declines Zostavax: N/A Covid 19: 2/2, 2021, pfizer   Pap: ?  2018/2019 with GYN history of abnormal pap 20+ years ago, normal since then, advised to follow up, she states will schedule LMP: postmenopausal MGM: 2017 DEXA: N/A  Colonoscopy: due, GI referral placed EGD: N/A  Patient Care Team: Lucky Cowboy, MD as PCP - General (Internal Medicine)  Allergies: No Known Allergies Medical History:  Past Medical History:  Diagnosis Date   Anxiety    DDD (degenerative disc disease)    GERD (gastroesophageal reflux disease)    Surgical History:  Past Surgical History:  Procedure Laterality Date   SPINE SURGERY  2007   L4-L5   TUBAL LIGATION     Family History:  Family History  Problem Relation Age of Onset   Cancer Father    Melanoma Sister    Cancer Paternal Uncle    Melanoma Paternal Uncle    Cancer Paternal Grandmother    Social History:  Social History   Tobacco Use   Smoking status: Never Smoker   Smokeless tobacco: Never Used  Substance Use Topics   Alcohol use: Yes    Comment: Sometimes   Drug use: No    Review of Systems: Review of Systems  Constitutional: Positive for malaise/fatigue. Negative for chills, diaphoresis, fever and weight loss.  HENT: Negative for congestion, ear discharge, ear pain, hearing loss, nosebleeds, sore throat and tinnitus.   Eyes: Negative.    Respiratory: Negative.  Negative for stridor.   Cardiovascular: Negative.   Gastrointestinal: Positive for nausea (x 3 weeks). Negative for abdominal pain, blood in stool, constipation, diarrhea, heartburn, melena and vomiting.  Genitourinary: Negative.   Musculoskeletal: Positive for back pain (chronic) and myalgias. Negative for falls, joint pain and neck pain.  Skin: Negative.   Neurological: Positive for tingling (bil feet, chronic). Negative for dizziness, tremors, sensory change, speech change, focal weakness, seizures, loss of consciousness, weakness and headaches.       Decreased sensation bil feet  Psychiatric/Behavioral: Negative for depression, hallucinations, memory loss, substance abuse and suicidal ideas. The patient has insomnia. The patient is not nervous/anxious.     Physical Exam: Estimated body mass index is 24.03 kg/m as calculated from the following:   Height as of this encounter: 5\' 4"  (1.626 m).   Weight as of this encounter: 140 lb (63.5 kg). BP 122/74    Pulse 74    Temp 97.7 F (36.5 C)    Ht 5\' 4"  (1.626 m)    Wt 140 lb (63.5 kg)    SpO2 96%    BMI 24.03 kg/m  General Appearance: Well nourished, in no apparent distress.  Eyes: PERRLA, EOMs, conjunctiva no swelling or erythema Sinuses: No Frontal/maxillary tenderness  ENT/Mouth: Ext aud canals clear, normal light reflex with TMs without erythema, bulging. Good dentition. No erythema, swelling, or exudate on post pharynx. Tonsils not swollen or erythematous. Hearing normal.  Neck: Supple, thyroid normal. No bruits  Respiratory: Respiratory effort normal, BS equal bilaterally without rales, rhonchi, wheezing or stridor.  Cardio: RRR without murmurs, rubs or gallops. Brisk peripheral pulses without edema.  Chest: symmetric, with normal excursions and percussion.  Breasts: lack of time, Defer to GYN, she states will schedule Abdomen: Soft, nontender, no guarding, rebound, hernias, masses, or organomegaly.   Lymphatics: Non tender without lymphadenopathy.  Genitourinary: defer to GYN Musculoskeletal: Symmetrical ROM all peripheral extremities, some limitations in shoulders bil, improved with passive, 5/5 strength but with spacticity against resistance,  normal gait.  Skin: Warm, dry without rashes, lesions, ecchymosis.  Neuro: Cranial nerves intact, reflexes equal bilaterally.  Normal muscle tone, no cerebellar symptoms. Sensation dulled to monofilament bil feet and ankles  Psych: Awake and oriented X 3, normal affect, Insight and Judgment appropriate.   EKG: WNL no changes. AORTA SCAN: defer  Dan Maker 8:58 AM [facility]

## 2020-03-04 NOTE — Patient Instructions (Addendum)
  Brenda Wilson , Thank you for taking time to come for your Wellness Visit. I appreciate your ongoing commitment to your health goals. Please review the following plan we discussed and let me know if I can assist you in the future.   This is a list of the screening recommended for you and due dates:  Health Maintenance  Topic Date Due  . Mammogram  11/10/2018  . Colon Cancer Screening  Never done  . Pap Smear  02/18/2019  . COVID-19 Vaccine (2 - Pfizer 2-dose series) 01/28/2020  . Flu Shot  03/19/2020*  . Tetanus Vaccine  07/31/2023  .  Hepatitis C: One time screening is recommended by Center for Disease Control  (CDC) for  adults born from [birth year] through 1967.   Completed  . HIV Screening  Discontinued  *Topic was postponed. The date shown is not the original due date.     HOW TO SCHEDULE A MAMMOGRAM  [facility]  7 a.m.-6:30 p.m., Monday 7 a.m.-5 p.m., Tuesday-Friday Schedule an appointment by calling [phone].  [facility] Schedule an appointment by calling [phone].    Will refer for colonoscopy   Zofran sent in for nausea     Know what a healthy weight is for you (roughly BMI <25) and aim to maintain this  Aim for 7+ servings of fruits and vegetables daily  65-80+ fluid ounces of water or unsweet tea for healthy kidneys  Limit to max 1 drink of alcohol per day; avoid smoking/tobacco  Limit animal fats in diet for cholesterol and heart health - choose grass fed whenever available  Avoid highly processed foods, and foods high in saturated/trans fats  Aim for low stress - take time to unwind and care for your mental health  Aim for 150 min of moderate intensity exercise weekly for heart health, and weights twice weekly for bone health  Aim for 7-9 hours of sleep daily     A great goal to work towards is aiming to get in a serving daily of some of the most nutritionally dense foods - G- BOMBS daily

## 2020-03-05 ENCOUNTER — Other Ambulatory Visit: Payer: Self-pay | Admitting: Adult Health

## 2020-03-05 ENCOUNTER — Encounter: Payer: Self-pay | Admitting: Adult Health

## 2020-03-05 DIAGNOSIS — R110 Nausea: Secondary | ICD-10-CM

## 2020-03-05 LAB — LIPID PANEL
Cholesterol: 186 mg/dL (ref <200)
HDL: 77 mg/dL (ref >=50)
LDL Cholesterol (Calc): 95 mg/dL (calc)
Non-HDL Cholesterol (Calc): 109 mg/dL (calc) (ref <130)
Total CHOL/HDL Ratio: 2.4 (calc) (ref <5.0)
Triglycerides: 58 mg/dL (ref <150)

## 2020-03-05 LAB — URINALYSIS, ROUTINE W REFLEX MICROSCOPIC
Bacteria, UA: NONE SEEN /HPF
Bilirubin Urine: NEGATIVE
Crystals: NONE SEEN /HPF
Glucose, UA: NEGATIVE
Hgb urine dipstick: NEGATIVE
Hyaline Cast: NONE SEEN /LPF
Ketones, ur: NEGATIVE
Nitrite: NEGATIVE
Protein, ur: NEGATIVE
RBC / HPF: NONE SEEN /HPF (ref 0–2)
Specific Gravity, Urine: 1.009 (ref 1.001–1.03)
WBC, UA: NONE SEEN /HPF (ref 0–5)
pH: 7 (ref 5.0–8.0)

## 2020-03-05 LAB — CBC WITH DIFFERENTIAL/PLATELET
Absolute Monocytes: 279 cells/uL (ref 200–950)
Basophils Absolute: 49 cells/uL (ref 0–200)
Basophils Relative: 1 %
Eosinophils Absolute: 49 cells/uL (ref 15–500)
Eosinophils Relative: 1 %
HCT: 37.4 % (ref 35.0–45.0)
Hemoglobin: 12.2 g/dL (ref 11.7–15.5)
Lymphs Abs: 1421 cells/uL (ref 850–3900)
MCH: 28.2 pg (ref 27.0–33.0)
MCHC: 32.6 g/dL (ref 32.0–36.0)
MCV: 86.4 fL (ref 80.0–100.0)
MPV: 10.4 fL (ref 7.5–12.5)
Monocytes Relative: 5.7 %
Neutro Abs: 3102 cells/uL (ref 1500–7800)
Neutrophils Relative %: 63.3 %
Platelets: 277 10*3/uL (ref 140–400)
RBC: 4.33 10*6/uL (ref 3.80–5.10)
RDW: 12.6 % (ref 11.0–15.0)
Total Lymphocyte: 29 %
WBC: 4.9 10*3/uL (ref 3.8–10.8)

## 2020-03-05 LAB — MAGNESIUM: Magnesium: 2.2 mg/dL (ref 1.5–2.5)

## 2020-03-05 LAB — COMPLETE METABOLIC PANEL WITH GFR
AG Ratio: 1.9 (calc) (ref 1.0–2.5)
ALT: 28 U/L (ref 6–29)
AST: 28 U/L (ref 10–35)
Albumin: 4.3 g/dL (ref 3.6–5.1)
Alkaline phosphatase (APISO): 110 U/L (ref 37–153)
BUN: 15 mg/dL (ref 7–25)
CO2: 30 mmol/L (ref 20–32)
Calcium: 9.4 mg/dL (ref 8.6–10.4)
Chloride: 105 mmol/L (ref 98–110)
Creat: 0.68 mg/dL (ref 0.50–1.05)
GFR, Est African American: 117 mL/min/{1.73_m2} (ref >=60)
GFR, Est Non African American: 101 mL/min/{1.73_m2} (ref >=60)
Globulin: 2.3 g/dL (calc) (ref 1.9–3.7)
Glucose, Bld: 102 mg/dL — ABNORMAL HIGH (ref 65–99)
Potassium: 5 mmol/L (ref 3.5–5.3)
Sodium: 139 mmol/L (ref 135–146)
Total Bilirubin: 0.3 mg/dL (ref 0.2–1.2)
Total Protein: 6.6 g/dL (ref 6.1–8.1)

## 2020-03-05 LAB — HEMOGLOBIN A1C
Hgb A1c MFr Bld: 5.5 % of total Hgb (ref <5.7)
Mean Plasma Glucose: 111 (calc)
eAG (mmol/L): 6.2 (calc)

## 2020-03-05 LAB — TSH: TSH: 0.75 mIU/L

## 2020-03-05 LAB — VITAMIN D 25 HYDROXY (VIT D DEFICIENCY, FRACTURES): Vit D, 25-Hydroxy: 50 ng/mL (ref 30–100)

## 2020-03-05 LAB — VITAMIN B12: Vitamin B-12: 704 pg/mL (ref 200–1100)

## 2020-03-05 LAB — LIPASE: Lipase: 28 U/L (ref 7–60)

## 2020-03-22 ENCOUNTER — Other Ambulatory Visit: Payer: Self-pay | Admitting: Adult Health

## 2020-03-22 ENCOUNTER — Telehealth: Payer: Self-pay

## 2020-03-22 DIAGNOSIS — F419 Anxiety disorder, unspecified: Secondary | ICD-10-CM

## 2020-03-22 MED ORDER — ALPRAZOLAM 0.5 MG PO TABS: ORAL_TABLET | ORAL | 0 refills | Status: DC

## 2020-03-22 NOTE — Telephone Encounter (Signed)
Just an FYI, Patient states that she takes Gabapentin, Meloxicam,  Baclofen and the Alprazolam QHS, and seems to really help with the pain.

## 2020-03-22 NOTE — Telephone Encounter (Signed)
Patient is requesting her prescription for Alprazolam be increased from .25mg  to .50mg . States that she isn't sleeping through the night due to pain. Please advise.

## 2020-03-22 NOTE — Telephone Encounter (Signed)
Patient states that she is going to talk with her daughter's neurologist to see whom they can recommend.

## 2020-04-09 ENCOUNTER — Encounter: Payer: BC Managed Care – PPO | Admitting: Adult Health

## 2020-04-27 ENCOUNTER — Other Ambulatory Visit: Payer: Self-pay | Admitting: Adult Health

## 2020-04-27 DIAGNOSIS — M62838 Other muscle spasm: Secondary | ICD-10-CM

## 2020-04-27 DIAGNOSIS — F419 Anxiety disorder, unspecified: Secondary | ICD-10-CM

## 2020-06-04 ENCOUNTER — Other Ambulatory Visit: Payer: Self-pay | Admitting: Adult Health

## 2020-06-04 DIAGNOSIS — F419 Anxiety disorder, unspecified: Secondary | ICD-10-CM

## 2020-07-14 ENCOUNTER — Other Ambulatory Visit: Payer: Self-pay | Admitting: Adult Health

## 2020-07-14 DIAGNOSIS — G609 Hereditary and idiopathic neuropathy, unspecified: Secondary | ICD-10-CM

## 2020-07-14 DIAGNOSIS — F419 Anxiety disorder, unspecified: Secondary | ICD-10-CM

## 2020-08-16 ENCOUNTER — Other Ambulatory Visit: Payer: Self-pay | Admitting: Adult Health

## 2020-08-16 DIAGNOSIS — F419 Anxiety disorder, unspecified: Secondary | ICD-10-CM

## 2020-08-30 ENCOUNTER — Other Ambulatory Visit: Payer: Self-pay | Admitting: Adult Health

## 2020-08-30 DIAGNOSIS — M62838 Other muscle spasm: Secondary | ICD-10-CM

## 2020-09-03 NOTE — Progress Notes (Signed)
6 MONTH FOLLOW UP  Assessment and Plan:   Anxiety Rare xanax PRN; declines daily agent; discussed counseling  stress management techniques discussed, increase water, good sleep hygiene discussed, increase exercise, and increase veggies.   Nausea/family history of pancreatic cancer She is very concerned about possible pancreatic cancer; has had normal labs; [place] ordered and pending  She is due for colonoscopy; will place GI referral and may also discuss with them Discussed   Hereditary peripheral neuropathy (r/t ALD) Myalgia Muscle spasticity Very limiting; extended discussion; she is switching jobs next week Does perceive benefit with current medications  interested in speaking with a specialist to help guide management of sx; likely will need to go to [facility]; she will reach out to familiar neuro for recommendation;  Lifestyle discussed;  -     baclofen (LIORESAL) 10 MG tablet; Take 1 tablet (10 mg total) by mouth 3 (three) times daily. -     gabapentin (NEURONTIN) 300 MG capsule; TAKE 1-3 CAPSULES BY MOUTH AT BEDTIME AS NEEDED FOR SLEEP AND PAIN -     ALPRAZolam (XANAX) 0.25 MG tablet; Take 1 tab up to 3 times a day as needed for severe anxiety. Limit to <5 days/week to avoid addiction. Do not drive or take with alcohol.  Vitamin D deficiency Continue supplement   Orders Placed This Encounter  Procedures  . Ambulatory referral to Gastroenterology    Discussed med's effects and SE's. Screening labs and tests as requested with regular follow-up as recommended. Over 40 minutes of exam, counseling, chart review, and critical decision making was performed this visit.   Future Appointments  Date Time Provider Department Center  03/08/2021  9:00 AM Judd Gaudier, NP [facility] None    HPI  50 y.o. female  presents for 6 month follow up. She has Adrenolekodystrophy (ALD) carrier; Other abnormal glucose (hx of prediabetes) ; Myalgia; Anxiety; and Hereditary peripheral neuropathy  on their problem list.  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 She started developing myalgias/weakness/stiffness around 2019 with extensive workups that were unremarkable, was referred to Dr. Lucia Gaskins. She had EMG studies on 07/31/19 which were unremarkable and was advised sx likely r/t ALD. She was recommended MRI but deferred due to cost concern. She was going to physical therapy for this, dry needling but with limited temporary benefit and stopped going due to cost.   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 Currently takes Gabapentin, Meloxicam, baclofen and the Alprazolam QHS with significant benefit. She is looking for [place] counseling.    BMI is Body mass index is 23.17 kg/m., she has not been working on diet and exercise. Wt Readings from Last 3 Encounters:  09/06/20 135 lb (61.2 kg)  03/04/20 140 lb (63.5 kg)  10/07/19 149 lb (67.6 kg)   Her blood pressure has been controlled at home, today their BP is BP: 108/72 She does not workout. She denies chest pain, shortness of breath, dizziness.   She is not on cholesterol medication and denies myalgias. Her cholesterol is at goal. The cholesterol last visit was:   Lab Results  Component Value Date   CHOL 186 03/04/2020   HDL 77 03/04/2020   LDLCALC 95 03/04/2020   TRIG 58 03/04/2020   CHOLHDL 2.4 03/04/2020   Last R4E in the office was:  Lab Results  Component Value Date   HGBA1C 5.5 03/04/2020   Patient is  on Vitamin D supplement.   Lab Results  Component Value Date   VD25OH 50 03/04/2020        Current Medications:  Current Outpatient Medications on File Prior to Visit  Medication Sig Dispense Refill  . acyclovir (ZOVIRAX) 800 MG tablet TAKE ONE TABLET BY MOUTH DAILY AS NEEDED 90 tablet 2  . ALPRAZolam (XANAX) 0.5 MG tablet TAKE ONE TABLET UP TO TWICE DAILY AS NEEDED FOR  SEVERE anxiety. limit TO less than FIVE DAYS A WEEK TO AVOID addiction. DO not drive OR TAKE WITH alcohol 50 tablet 0  . baclofen (LIORESAL) 10 MG tablet TAKE ONE TABLET BY MOUTH THREE TIMES DAILY 90 tablet 2  . gabapentin (NEURONTIN) 300 MG capsule TAKE 1-3 CAPSULES AT BEDTIME AS NEEDED FOR SLEEP AND PAIN 90 capsule 2  . meloxicam (MOBIC) 15 MG tablet Take 1/2 to 1 tablet Daily with Food for Pain & Inflammation & try Limit to 5 days /week to Avoid Kidney Damage 90 tablet 0  . Magnesium 250 MG TABS Take 1 tablet by mouth daily. (Patient not taking: Reported on 09/06/2020)    . Multiple Vitamins-Minerals (MULTIVITAMIN ADULT) CHEW Chew by mouth. (Patient not taking: Reported on 09/06/2020)    . ondansetron (ZOFRAN) 4 MG tablet Take 1 tablet (4 mg total) by mouth every 8 (eight) hours as needed for nausea or vomiting. (Patient not taking: Reported on 09/06/2020) 90 tablet 0   No current facility-administered medications on file prior to visit.   Allergies: No Known Allergies Medical History:  Past Medical History:  Diagnosis Date  . Adrenolekodystrophy (ALD) carrier 09/21/2017   Adrenoleukodystrophy (ALD) carrier  . Anxiety   . DDD (degenerative disc disease)   . GERD (gastroesophageal reflux disease)    Surgical History:  Past Surgical History:  Procedure Laterality Date  . SPINE SURGERY  2007   L4-L5  . TUBAL LIGATION     Family History:  Family History  Problem Relation Age of Onset  . Lung cancer Father 1       smoker  . Melanoma Sister   . Cancer Paternal Uncle   . Melanoma Paternal Uncle   . Lung cancer Paternal Grandmother        smoker  . Pancreatic cancer Mother 24  . Other Maternal Grandmother        ADL carrier  . Other Maternal Uncle 12       ADL  . Seizures Daughter     Review of Systems: Review of Systems  Constitutional: Positive for malaise/fatigue. Negative for chills, diaphoresis, fever and weight loss.  HENT: Negative for congestion, ear discharge, ear  pain, hearing loss, nosebleeds, sore throat and tinnitus.   Eyes: Negative.   Respiratory: Negative.  Negative for stridor.   Cardiovascular: Negative.   Gastrointestinal: Positive for nausea (mild, intermittent). Negative for abdominal pain, blood in stool, constipation, diarrhea, heartburn, melena and vomiting.  Genitourinary: Negative.   Musculoskeletal: Positive for back pain (chronic) and myalgias. Negative for falls, joint pain and neck pain.  Skin: Negative.   Neurological: Positive for tingling (bil feet, chronic). Negative for dizziness, tremors, sensory change, speech change, focal weakness, seizures, loss of consciousness, weakness and headaches.       Decreased sensation bil feet  Psychiatric/Behavioral: Negative for depression, hallucinations, memory loss, substance abuse and suicidal ideas. The patient has insomnia (improved with medications). The patient is not nervous/anxious.     Physical Exam: Estimated body mass index is 23.17 kg/m as calculated from the following:  Height as of 03/04/20: 5\' 4"  (1.626 m).   Weight as of this encounter: 135 lb (61.2 kg). BP 108/72   Pulse 98   Temp (!) 97.5 F (36.4 C)   Wt 135 lb (61.2 kg)   SpO2 99%   BMI 23.17 kg/m  General Appearance: Well nourished, in no apparent distress.  Eyes: PERRLA, EOMs, conjunctiva no swelling or erythema Sinuses: No Frontal/maxillary tenderness  ENT/Mouth: Ext aud canals clear, normal light reflex with TMs without erythema, bulging. Good dentition. No erythema, swelling, or exudate on post pharynx. Tonsils not swollen or erythematous. Hearing normal.  Neck: Supple, thyroid normal. No bruits  Respiratory: Respiratory effort normal, BS equal bilaterally without rales, rhonchi, wheezing or stridor.  Cardio: RRR without murmurs, rubs or gallops. Brisk peripheral pulses without edema.  Chest: symmetric, with normal excursions and percussion.  Abdomen: Soft, nontender, no guarding, rebound, hernias, masses,  or organomegaly.  Lymphatics: Non tender without lymphadenopathy.  Musculoskeletal: Symmetrical ROM all peripheral extremities, some limitations in shoulders bil, improved with passive, 5/5 strength but with spacticity against resistance,  normal gait.  Skin: Warm, dry without rashes, lesions, ecchymosis.  Neuro: Cranial nerves intact, reflexes equal bilaterally. Normal muscle tone, no cerebellar symptoms. Sensation dulled to monofilament bil feet and ankles  Psych: Awake and oriented X 3, normal affect, Insight and Judgment appropriate.   Jaegar Croft 1:09 PM [facility]

## 2020-09-06 ENCOUNTER — Other Ambulatory Visit: Payer: Self-pay

## 2020-09-06 ENCOUNTER — Ambulatory Visit: Payer: BC Managed Care – PPO | Admitting: Adult Health

## 2020-09-06 ENCOUNTER — Encounter: Payer: Self-pay | Admitting: Adult Health

## 2020-09-06 VITALS — BP 108/72 | HR 98 | Temp 97.5°F | Wt 135.0 lb

## 2020-09-06 DIAGNOSIS — M791 Myalgia, unspecified site: Secondary | ICD-10-CM

## 2020-09-06 DIAGNOSIS — G609 Hereditary and idiopathic neuropathy, unspecified: Secondary | ICD-10-CM | POA: Diagnosis not present

## 2020-09-06 DIAGNOSIS — Z148 Genetic carrier of other disease: Secondary | ICD-10-CM

## 2020-09-06 DIAGNOSIS — F419 Anxiety disorder, unspecified: Secondary | ICD-10-CM

## 2020-09-06 DIAGNOSIS — Z6824 Body mass index (BMI) 24.0-24.9, adult: Secondary | ICD-10-CM

## 2020-09-06 DIAGNOSIS — Z1211 Encounter for screening for malignant neoplasm of colon: Secondary | ICD-10-CM

## 2020-09-06 DIAGNOSIS — R7309 Other abnormal glucose: Secondary | ICD-10-CM

## 2020-09-06 NOTE — Patient Instructions (Signed)
YOU CAN CALL TO MAKE AN ULTRASOUND..  I have put in an order for an ultrasound for you to have You can set them up at your convenience by calling this number [phone] You will likely have the ultrasound at [address] [facility] [address]  If you have any issues call our office and we will set this up for you.        Antiinflammatory diet  Doctors are learning that one of the best ways to reduce inflammation lies not in the medicine cabinet, but in the refrigerator.  By following an anti-inflammatory diet you can fight off inflammation for good.  What does an anti-inflammatory diet do? Your immune system becomes activated when your body recognizes anything that is foreign--such as an invading microbe, plant pollen, or chemical. This often triggers a process called inflammation. Intermittent bouts of inflammation directed at truly threatening invaders protect your health.  However, sometimes inflammation persists, day in and day out, even when you are not threatened by a foreign invader. That's when inflammation can become your enemy. Many major diseases that plague us--including cancer, heart disease, diabetes, arthritis, depression, and Alzheimer's--have been linked to chronic inflammation.  One of the most powerful tools to combat inflammation comes not from the pharmacy, but from the grocery store.   Protect yourself from the damage of chronic inflammation. Science has proven that chronic, low-grade inflammation can turn into a silent killer that contributes to cardiovascular disease, cancer, type 2 diabetes and other conditions.   Choose the right anti-inflammatory foods, and you may be able to reduce your risk of illness. Consistently pick the wrong ones, and you could accelerate the inflammatory disease process.     Foods that cause inflammation Try to avoid or limit these foods as much as possible:   refined carbohydrates, such as white bread and pastries  [place] fries and  other fried foods  soda and other sugar-sweetened beverages  red meat (burgers, steaks) and processed meat (hot dogs, sausage)  margarine, shortening, and lard  The health risks of inflammatory foods Not surprisingly, the same foods on an inflammation diet are generally considered bad for our health, including sodas and refined carbohydrates, as well as red meat and processed meats.  "Some of the foods that have been associated with an increased risk for chronic diseases such as type 2 diabetes and heart disease are also associated with excess inflammation," Dr. Si Gaul says. "It's not surprising, since inflammation is an important underlying mechanism for the development of these diseases."  Unhealthy foods also contribute to weight gain, which is itself a risk factor for inflammation. Yet in several studies, even after researchers took obesity into account, the link between foods and inflammation remained, which suggests weight gain isn't the sole driver. "Some of the food components or ingredients may have independent effects on inflammation over and above increased caloric intake," Dr. Si Gaul says.  Anti-inflammatory foods An anti-inflammatory diet should include these foods:   tomatoes  olive oil  green leafy vegetables, such as spinach, kale, and collards  nuts like almonds and walnuts  fatty fish like salmon, mackerel, tuna, and sardines  fruits such as strawberries, blueberries, cherries, and oranges  Benefits of anti-inflammatory foods On the flip side are beverages and foods that reduce inflammation, in particular fruits and vegetables such as blueberries, apples, and leafy greens that are high in natural antioxidants and polyphenols--protective compounds found in plants.  Studies have also associated nuts with reduced markers of inflammation and  a lower risk of cardiovascular disease and diabetes. Coffee, which contains polyphenols and other anti-inflammatory compounds, may  protect against inflammation, as well.  Anti-inflammatory diet To reduce levels of inflammation, aim for an overall healthy diet. If you're looking for an eating plan that closely follows the tenets of anti-inflammatory eating, consider the Mediterranean diet, which is high in fruits, vegetables, nuts, whole grains, fish, and healthy oils.  In addition to lowering inflammation, a more natural, less processed diet can have noticeable effects on your physical and emotional health and overall quality of life.   [email or web address]://[email or web address].com.br

## 2020-09-11 ENCOUNTER — Other Ambulatory Visit: Payer: Self-pay | Admitting: Adult Health

## 2020-09-11 DIAGNOSIS — F419 Anxiety disorder, unspecified: Secondary | ICD-10-CM

## 2020-10-07 ENCOUNTER — Encounter: Payer: Self-pay | Admitting: Gastroenterology

## 2020-10-11 ENCOUNTER — Other Ambulatory Visit: Payer: Self-pay | Admitting: Adult Health Nurse Practitioner

## 2020-10-11 DIAGNOSIS — F419 Anxiety disorder, unspecified: Secondary | ICD-10-CM

## 2020-10-14 ENCOUNTER — Encounter: Payer: Self-pay | Admitting: Adult Health

## 2020-10-15 ENCOUNTER — Encounter: Payer: Self-pay | Admitting: Adult Health

## 2020-10-18 ENCOUNTER — Other Ambulatory Visit: Payer: Self-pay | Admitting: Internal Medicine

## 2020-10-18 DIAGNOSIS — Z1231 Encounter for screening mammogram for malignant neoplasm of breast: Secondary | ICD-10-CM

## 2020-10-20 ENCOUNTER — Other Ambulatory Visit: Payer: Self-pay

## 2020-10-20 ENCOUNTER — Ambulatory Visit
Admission: RE | Admit: 2020-10-20 | Discharge: 2020-10-20 | Disposition: A | Discharge status: Home / Self Care | Payer: BC Managed Care – PPO | Source: Ambulatory Visit | Attending: Internal Medicine | Admitting: Internal Medicine

## 2020-10-20 DIAGNOSIS — Z1231 Encounter for screening mammogram for malignant neoplasm of breast: Secondary | ICD-10-CM

## 2020-10-25 ENCOUNTER — Other Ambulatory Visit: Payer: Self-pay | Admitting: Adult Health

## 2020-10-25 DIAGNOSIS — G609 Hereditary and idiopathic neuropathy, unspecified: Secondary | ICD-10-CM

## 2020-10-28 ENCOUNTER — Other Ambulatory Visit: Payer: Self-pay

## 2020-10-28 ENCOUNTER — Ambulatory Visit: Payer: BC Managed Care – PPO | Admitting: Adult Health

## 2020-10-28 ENCOUNTER — Encounter: Payer: Self-pay | Admitting: Adult Health

## 2020-10-28 VITALS — BP 102/66 | HR 91 | Temp 97.5°F | Wt 134.0 lb

## 2020-10-28 DIAGNOSIS — R109 Unspecified abdominal pain: Secondary | ICD-10-CM

## 2020-10-28 DIAGNOSIS — R10816 Epigastric abdominal tenderness: Secondary | ICD-10-CM

## 2020-10-28 DIAGNOSIS — R110 Nausea: Secondary | ICD-10-CM

## 2020-10-28 MED ORDER — PROMETHAZINE HCL 25 MG PO TABS: 12.5000 mg | ORAL_TABLET | Freq: Four times a day (QID) | ORAL | 0 refills | Status: DC | PRN

## 2020-10-28 NOTE — Patient Instructions (Addendum)
Pick up nexium 20 mg cap - take 1 at night daily for the next 1-2 weeks  Stick to bland foods, no fatty or spicy foods    .YOU CAN CALL TO MAKE AN ULTRASOUND..  I have put in an order for an ultrasound for you to have You can set them up at your convenience by calling this number [phone] You will likely have the ultrasound at [address] [facility] [address]  If you have any issues call our office and we will set this up for you.        Nausea, Adult Nausea is the feeling that you have an upset stomach or that you are about to vomit. Nausea on its own is not usually a serious concern, but it may be an early sign of a more serious medical problem. As nausea gets worse, it can lead to vomiting. If vomiting develops, or if you are not able to drink enough fluids, you are at risk of becoming dehydrated. Dehydration can make you tired and thirsty, cause you to have a dry mouth, and decrease how often you urinate. Older adults and people with other diseases or a weak disease-fighting system (immune system) are at higher risk for dehydration. The main goals of treating your nausea are:  To relieve your nausea.  To limit repeated nausea episodes.  To prevent vomiting and dehydration. Follow these instructions at home: Watch your symptoms for any changes. Tell your health care provider about them. Follow these instructions as told by your health care provider. Eating and drinking  Take an oral rehydration solution (ORS). This is a drink that is sold at pharmacies and retail stores.  Drink clear fluids slowly and in small amounts as you are able. Clear fluids include water, ice chips, low-calorie sports drinks, and fruit juice that has water added (diluted fruit juice).  Eat bland, easy-to-digest foods in small amounts as you are able. These foods include bananas, applesauce, rice, lean meats, toast, and crackers.  Avoid drinking fluids that contain a lot of sugar or caffeine, such as  energy drinks, sports drinks, and soda.  Avoid alcohol.  Avoid spicy or fatty foods.      General instructions  Take over-the-counter and prescription medicines only as told by your health care provider.  Rest at home while you recover.  Drink enough fluid to keep your urine pale yellow.  Breathe slowly and deeply when you feel nauseous.  Avoid smelling things that have strong odors.  Wash your hands often using soap and water. If soap and water are not available, use hand sanitizer.  Make sure that all people in your household wash their hands well and often.  Keep all follow-up visits as told by your health care provider. This is important. Contact a health care provider if:  Your nausea gets worse.  Your nausea does not go away after two days.  You vomit.  You cannot drink fluids without vomiting.  You have any of the following: ? New symptoms. ? A fever. ? A headache. ? Muscle cramps. ? A rash. ? Pain while urinating.  You feel light-headed or dizzy. Get help right away if:  You have pain in your chest, neck, arm, or jaw.  You feel extremely weak or you faint.  You have vomit that is bright red or looks like coffee grounds.  You have bloody or black stools or stools that look like tar.  You have a severe headache, a stiff  neck, or both.  You have severe pain, cramping, or bloating in your abdomen.  You have difficulty breathing or are breathing very quickly.  Your heart is beating very quickly.  Your skin feels cold and clammy.  You feel confused.  You have signs of dehydration, such as: ? Dark urine, very little urine, or no urine. ? Cracked lips. ? Dry mouth. ? Sunken eyes. ? Sleepiness. ? Weakness. These symptoms may represent a serious problem that is an emergency. Do not wait to see if the symptoms will go away. Get medical help right away. Call your local emergency services (911 in the [place] Do not drive yourself to the  hospital. Summary  Nausea is the feeling that you have an upset stomach or that you are about to vomit. Nausea on its own is not usually a serious concern, but it may be an early sign of a more serious medical problem.  If vomiting develops, or if you are not able to drink enough fluids, you are at risk of becoming dehydrated.  Follow recommendations for eating and drinking and take over-the-counter and prescription medicines only as told by your health care provider.  Contact a health care provider right away if your symptoms worsen or you have new symptoms.  Keep all follow-up visits as told by your health care provider. This is important. This information is not intended to replace advice given to you by your health care provider. Make sure you discuss any questions you have with your health care provider. Document Revised: 05/06/2019 Document Reviewed: 11/13/2017 Elsevier Patient Education  2021 [employer].

## 2020-10-28 NOTE — Progress Notes (Signed)
Assessment and Plan:  Brenda Wilson was seen today for abdominal pain.  Diagnoses and all orders for this visit:  Nausea/abdominal discomfort Vague epigastric/upper abdominal tenderness Check upper abd labs, r/o gallbladder, concern due to maternal hx of pancreatic ca Check abd [place] Possible simple gastroenteritis -bland foods, plenty of fluids, add nexium 20 mg qHS x 2 weeks ? Greasy stools, malabsorption issue - if persistent return for further labs vs defer to GI upcoming Please go to the ER if you have any severe AB pain, unable to hold down food/water, blood in stool or vomit, chest pain, shortness of breath, or any worsening symptoms.  -     CBC with Differential/Platelet -     COMPLETE METABOLIC PANEL WITH GFR -     Lipase -     promethazine (PHENERGAN) 25 MG tablet; Take 0.5-1 tablets (12.5-25 mg total) by mouth every 6 (six) hours as needed for nausea or vomiting (can cause fatigue). Max: 4 tablets per day -     US Abdomen Complete; Future  Further disposition pending results of labs. Discussed med's effects and SE's.   Over 30 minutes of exam, counseling, chart review, and critical decision making was performed.   Future Appointments  Date Time Provider Department Center  11/09/2020  8:00 AM [facility] PREVISIT RM 52 [facility]  12/06/2020  2:30 PM Danis, Andreas Blower, MD [facility]  03/08/2021  9:00 AM Judd Gaudier, NP [facility] None    ------------------------------------------------------------------------------------------------------------------   HPI BP 102/66   Pulse 91   Temp (!) 97.5 F (36.4 C)   Wt 134 lb (60.8 kg)   SpO2 98%   BMI 23.00 kg/m   50 y.o.female presents for evaluation nausea.   She reports 4 days ago woke up nausous, mild, was tolerable, worse the next day, felt "off", early Wednesday AM work up more nauseous, had zofran and took without benefit, had 1 episode of emesis. Now has urge but nothing coming up. Also notes belching/burping.  Also sense of bloating. Upper abdominal discomfort, vague.   She reports loose stools, greasy appearing. Denies mucus or blood in stools. She has documented hx of GERD but she doesn't recall this. Denies ETOH, NSAIDs. She tried pepto bismol with limited benefit.   She reports poor appetitie, Eating jello, propel water.   She denies URI sx, rash, fever/chills, dyspnea, CP, HA, urine changes. She has sweats attributed to hot flashes/menopausal changes, no unintended weight loss or atypical fatigue until this week with poor oral intake.   She is scheduled to have initial colonoscopy by Dr. Myrtie Neither in June 2022. Mom with hx of pancreatic cancer, had similar nausea, bloating and very anxious.   BMI is Body mass index is 23 kg/m. Wt Readings from Last 3 Encounters:  10/28/20 134 lb (60.8 kg)  09/06/20 135 lb (61.2 kg)  03/04/20 140 lb (63.5 kg)     Past Medical History:  Diagnosis Date  . Adrenolekodystrophy (ALD) carrier 09/21/2017   Adrenoleukodystrophy (ALD) carrier  . Anxiety   . DDD (degenerative disc disease)   . GERD (gastroesophageal reflux disease)      No Known Allergies  Current Outpatient Medications on File Prior to Visit  Medication Sig  . acyclovir (ZOVIRAX) 800 MG tablet TAKE ONE TABLET BY MOUTH DAILY AS NEEDED  . ALPRAZolam (XANAX) 0.5 MG tablet TAKE ONE TABLET BY MOUTH UP TO TWICE DAILY AS NEEDED FOR SEVERE anxiety. limit TO less THAN FIVE DAYS A WEEK TO AVOID addiction. DO not drive OR  TAKE WITH alcohol  . baclofen (LIORESAL) 10 MG tablet TAKE ONE TABLET BY MOUTH THREE TIMES DAILY  . gabapentin (NEURONTIN) 300 MG capsule TAKE 1-3 CAPSULES AT BEDTIME AS NEEDED FOR SLEEP AND PAIN  . meloxicam (MOBIC) 15 MG tablet Take 1/2 to 1 tablet Daily with Food for Pain & Inflammation & try Limit to 5 days /week to Avoid Kidney Damage  . Magnesium 250 MG TABS Take 1 tablet by mouth daily. (Patient not taking: No sig reported)  . Multiple Vitamins-Minerals (MULTIVITAMIN ADULT) CHEW  Chew by mouth. (Patient not taking: No sig reported)  . ondansetron (ZOFRAN) 4 MG tablet Take 1 tablet (4 mg total) by mouth every 8 (eight) hours as needed for nausea or vomiting. (Patient not taking: No sig reported)   No current facility-administered medications on file prior to visit.    ROS: all negative except above.   Physical Exam:  BP 102/66   Pulse 91   Temp (!) 97.5 F (36.4 C)   Wt 134 lb (60.8 kg)   SpO2 98%   BMI 23.00 kg/m   General Appearance: Well nourished, in no apparent distress. Eyes: PERRLA, EOMs, conjunctiva no swelling or erythema Sinuses: No Frontal/maxillary tenderness ENT/Mouth: Ext aud canals clear, TMs without erythema, bulging. No erythema, swelling, or exudate on post pharynx.  Tonsils not swollen or erythematous. Hearing normal.  Neck: Supple, thyroid normal.  Respiratory: Respiratory effort normal, BS equal bilaterally without rales, rhonchi, wheezing or stridor.  Cardio: RRR with no MRGs. Brisk peripheral pulses without edema.  Abdomen: Soft, + BS.  Non tender, no guarding, rebound, hernias, masses. Lymphatics: Non tender without lymphadenopathy.  Musculoskeletal: Full ROM, 5/5 strength, normal gait.  Skin: Warm, dry without rashes, lesions, ecchymosis.  Neuro: Cranial nerves intact. Normal muscle tone, no cerebellar symptoms. Sensation intact.  Psych: Awake and oriented X 3, normal affect, Insight and Judgment appropriate.     Dan Maker, NP 4:00 PM [facility]

## 2020-10-29 LAB — CBC WITH DIFFERENTIAL/PLATELET
Absolute Monocytes: 269 cells/uL (ref 200–950)
Basophils Absolute: 38 cells/uL (ref 0–200)
Basophils Relative: 0.8 %
Eosinophils Absolute: 19 cells/uL (ref 15–500)
Eosinophils Relative: 0.4 %
HCT: 38.5 % (ref 35.0–45.0)
Hemoglobin: 12.7 g/dL (ref 11.7–15.5)
Lymphs Abs: 1829 cells/uL (ref 850–3900)
MCH: 28.1 pg (ref 27.0–33.0)
MCHC: 33 g/dL (ref 32.0–36.0)
MCV: 85.2 fL (ref 80.0–100.0)
MPV: 10 fL (ref 7.5–12.5)
Monocytes Relative: 5.6 %
Neutro Abs: 2645 cells/uL (ref 1500–7800)
Neutrophils Relative %: 55.1 %
Platelets: 250 10*3/uL (ref 140–400)
RBC: 4.52 10*6/uL (ref 3.80–5.10)
RDW: 12.5 % (ref 11.0–15.0)
Total Lymphocyte: 38.1 %
WBC: 4.8 10*3/uL (ref 3.8–10.8)

## 2020-10-29 LAB — NO CULTURE INDICATED

## 2020-10-29 LAB — URINALYSIS W MICROSCOPIC + REFLEX CULTURE
Bacteria, UA: NONE SEEN /HPF
Bilirubin Urine: NEGATIVE
Glucose, UA: NEGATIVE
Hgb urine dipstick: NEGATIVE
Hyaline Cast: NONE SEEN /LPF
Leukocyte Esterase: NEGATIVE
Nitrites, Initial: NEGATIVE
Protein, ur: NEGATIVE
RBC / HPF: NONE SEEN /HPF (ref 0–2)
Specific Gravity, Urine: 1.019 (ref 1.001–1.035)
pH: 5.5 (ref 5.0–8.0)

## 2020-10-29 LAB — COMPLETE METABOLIC PANEL WITH GFR
AG Ratio: 1.8 (calc) (ref 1.0–2.5)
ALT: 16 U/L (ref 6–29)
AST: 17 U/L (ref 10–35)
Albumin: 4.4 g/dL (ref 3.6–5.1)
Alkaline phosphatase (APISO): 83 U/L (ref 37–153)
BUN: 12 mg/dL (ref 7–25)
CO2: 27 mmol/L (ref 20–32)
Calcium: 9.5 mg/dL (ref 8.6–10.4)
Chloride: 104 mmol/L (ref 98–110)
Creat: 0.61 mg/dL (ref 0.50–1.05)
GFR, Est African American: 122 mL/min/{1.73_m2} (ref >=60)
GFR, Est Non African American: 105 mL/min/{1.73_m2} (ref >=60)
Globulin: 2.4 g/dL (calc) (ref 1.9–3.7)
Glucose, Bld: 88 mg/dL (ref 65–99)
Potassium: 3.9 mmol/L (ref 3.5–5.3)
Sodium: 139 mmol/L (ref 135–146)
Total Bilirubin: 0.4 mg/dL (ref 0.2–1.2)
Total Protein: 6.8 g/dL (ref 6.1–8.1)

## 2020-10-29 LAB — LIPASE: Lipase: 29 U/L (ref 7–60)

## 2020-11-01 ENCOUNTER — Other Ambulatory Visit: Payer: Self-pay | Admitting: Adult Health

## 2020-11-01 DIAGNOSIS — F419 Anxiety disorder, unspecified: Secondary | ICD-10-CM

## 2020-11-03 ENCOUNTER — Other Ambulatory Visit: Payer: Self-pay | Admitting: Adult Health

## 2020-11-03 DIAGNOSIS — F419 Anxiety disorder, unspecified: Secondary | ICD-10-CM

## 2020-11-04 ENCOUNTER — Other Ambulatory Visit: Payer: Self-pay | Admitting: Adult Health

## 2020-11-04 DIAGNOSIS — F419 Anxiety disorder, unspecified: Secondary | ICD-10-CM

## 2020-11-04 MED ORDER — ALPRAZOLAM 0.5 MG PO TABS: ORAL_TABLET | ORAL | 0 refills | Status: DC

## 2020-11-09 ENCOUNTER — Ambulatory Visit (AMBULATORY_SURGERY_CENTER): Payer: BC Managed Care – PPO

## 2020-11-09 ENCOUNTER — Other Ambulatory Visit: Payer: Self-pay

## 2020-11-09 VITALS — Ht 64.0 in | Wt 126.6 lb

## 2020-11-09 DIAGNOSIS — Z1211 Encounter for screening for malignant neoplasm of colon: Secondary | ICD-10-CM

## 2020-11-09 MED ORDER — PEG 3350-KCL-NA BICARB-NACL 420 G PO SOLR: 4000.0000 mL | Freq: Once | ORAL | 0 refills | Status: AC

## 2020-11-09 NOTE — Progress Notes (Signed)
Pt verified name, DOB, address and insurance during PV today.   Pt mailed instruction packet to include, copy of consent form to read and not return, and instructions. PV completed over the phone. Pt encouraged to call with questions or issues.  No allergies to soy or egg Pt is not on blood thinners or diet pills Denies issues with sedation/intubation Denies atrial flutter/fib Denies constipation   Emmi instructions given to pt  Pt is aware of Covid safety and care partner requirements.

## 2020-11-23 ENCOUNTER — Ambulatory Visit
Admission: RE | Admit: 2020-11-23 | Discharge: 2020-11-23 | Disposition: A | Discharge status: Home / Self Care | Payer: BC Managed Care – PPO | Source: Ambulatory Visit | Attending: Adult Health | Admitting: Adult Health

## 2020-11-23 DIAGNOSIS — R110 Nausea: Secondary | ICD-10-CM

## 2020-11-23 DIAGNOSIS — R109 Unspecified abdominal pain: Secondary | ICD-10-CM

## 2020-11-23 DIAGNOSIS — R10816 Epigastric abdominal tenderness: Secondary | ICD-10-CM

## 2020-11-23 DIAGNOSIS — R1013 Epigastric pain: Secondary | ICD-10-CM | POA: Diagnosis not present

## 2020-11-29 ENCOUNTER — Encounter: Payer: BC Managed Care – PPO | Admitting: Gastroenterology

## 2020-12-02 ENCOUNTER — Other Ambulatory Visit: Payer: Self-pay | Admitting: Adult Health

## 2020-12-02 DIAGNOSIS — F419 Anxiety disorder, unspecified: Secondary | ICD-10-CM

## 2020-12-05 ENCOUNTER — Encounter: Payer: Self-pay | Admitting: Certified Registered Nurse Anesthetist

## 2020-12-06 ENCOUNTER — Encounter: Payer: Self-pay | Admitting: Gastroenterology

## 2020-12-06 ENCOUNTER — Ambulatory Visit (AMBULATORY_SURGERY_CENTER): Payer: BC Managed Care – PPO | Admitting: Gastroenterology

## 2020-12-06 ENCOUNTER — Other Ambulatory Visit: Payer: Self-pay

## 2020-12-06 VITALS — BP 107/64 | HR 76 | Temp 97.5°F | Resp 12 | Ht 64.0 in | Wt 126.0 lb

## 2020-12-06 DIAGNOSIS — Z1211 Encounter for screening for malignant neoplasm of colon: Secondary | ICD-10-CM

## 2020-12-06 MED ORDER — SODIUM CHLORIDE 0.9 % IV SOLN: 500.0000 mL | Freq: Once | INTRAVENOUS | Status: DC

## 2020-12-06 NOTE — Progress Notes (Signed)
Report given to PACU, vss 

## 2020-12-06 NOTE — Op Note (Signed)
[facility] Patient Name: Brenda Wilson Procedure Date: 12/06/2020 1:50 PM MRN: [id number] Endoscopist: Sherilyn Cooter L. Myrtie Wilson , MD Age: 50 Referring MD:  Date of Birth: July 16, 1970 Gender: Female Account #: [id number] Procedure:                Colonoscopy Indications:              Screening for colorectal malignant neoplasm, This                            is the patient's first colonoscopy Medicines:                Monitored Anesthesia Care Procedure:                Pre-Anesthesia Assessment:                           - Prior to the procedure, a History and Physical                            was performed, and patient medications and                            allergies were reviewed. The patient's tolerance of                            previous anesthesia was also reviewed. The risks                            and benefits of the procedure and the sedation                            options and risks were discussed with the patient.                            All questions were answered, and informed consent                            was obtained. Prior Anticoagulants: The patient has                            taken no previous anticoagulant or antiplatelet                            agents. ASA Grade Assessment: II - A patient with                            mild systemic disease. After reviewing the risks                            and benefits, the patient was deemed in                            satisfactory condition to undergo the procedure.  After obtaining informed consent, the colonoscope                            was passed under direct vision. Throughout the                            procedure, the patient's blood pressure, pulse, and                            oxygen saturations were monitored continuously. The                            Olympus [id number]) [id number] was introduced                            through the anus and  advanced to the the terminal                            ileum, with identification of the appendiceal                            orifice and IC valve. The colonoscopy was performed                            without difficulty. The patient tolerated the                            procedure well. The quality of the bowel                            preparation was excellent after lavage. The                            terminal ileum, ileocecal valve, appendiceal                            orifice, and rectum were photographed. The bowel                            preparation used was GoLYTELY. Scope In: 2:05:44 PM Scope Out: 2:17:13 PM Scope Withdrawal Time: 0 hours 8 minutes 38 seconds  Total Procedure Duration: 0 hours 11 minutes 29 seconds  Findings:                 The perianal and digital rectal examinations were                            normal.                           The terminal ileum appeared normal.                           Multiple diverticula were found in the left colon  and right colon.                           The exam was otherwise without abnormality on                            direct and retroflexion views. Complications:            No immediate complications. Estimated Blood Loss:     Estimated blood loss: none. Impression:               - The examined portion of the ileum was normal.                           - Diverticulosis in the left colon and in the right                            colon.                           - The examination was otherwise normal on direct                            and retroflexion views.                           - No specimens collected. Recommendation:           - Patient has a contact number available for                            emergencies. The signs and symptoms of potential                            delayed complications were discussed with the                            patient. Return to  normal activities tomorrow.                            Written discharge instructions were provided to the                            patient.                           - Resume previous diet.                           - Continue present medications.                           - Repeat colonoscopy in 10 years for screening                            purposes. Brenda Clasby L. Myrtie Neither, MD 12/06/2020 2:20:51 PM This report has been signed electronically.

## 2020-12-06 NOTE — Patient Instructions (Signed)
HANDOUT ON DIVERTICULOSIS GIVEN TO YOU TODAY    YOU HAD AN ENDOSCOPIC PROCEDURE TODAY AT THE [facility]:   Refer to the procedure report that was given to you for any specific questions about what was found during the examination.  If the procedure report does not answer your questions, please call your gastroenterologist to clarify.  If you requested that your care partner not be given the details of your procedure findings, then the procedure report has been included in a sealed envelope for you to review at your convenience later.  YOU SHOULD EXPECT: Some feelings of bloating in the abdomen. Passage of more gas than usual.  Walking can help get rid of the air that was put into your GI tract during the procedure and reduce the bloating. If you had a lower endoscopy (such as a colonoscopy or flexible sigmoidoscopy) you may notice spotting of blood in your stool or on the toilet paper. If you underwent a bowel prep for your procedure, you may not have a normal bowel movement for a few days.  Please Note:  You might notice some irritation and congestion in your nose or some drainage.  This is from the oxygen used during your procedure.  There is no need for concern and it should clear up in a day or so.  SYMPTOMS TO REPORT IMMEDIATELY:  Following lower endoscopy (colonoscopy or flexible sigmoidoscopy):  Excessive amounts of blood in the stool  Significant tenderness or worsening of abdominal pains  Swelling of the abdomen that is new, acute  Fever of 100F or higher   For urgent or emergent issues, a gastroenterologist can be reached at any hour by calling [phone]. Do not use MyChart messaging for urgent concerns.    DIET:  We do recommend a small meal at first, but then you may proceed to your regular diet.  Drink plenty of fluids but you should avoid alcoholic beverages for 24 hours.  ACTIVITY:  You should plan to take it easy for the rest of today and you should NOT  DRIVE or use heavy machinery until tomorrow (because of the sedation medicines used during the test).    FOLLOW UP: Our staff will call the number listed on your records 48-72 hours following your procedure to check on you and address any questions or concerns that you may have regarding the information given to you following your procedure. If we do not reach you, we will leave a message.  We will attempt to reach you two times.  During this call, we will ask if you have developed any symptoms of COVID 19. If you develop any symptoms (ie: fever, flu-like symptoms, shortness of breath, cough etc.) before then, please call [phone].  If you test positive for Covid 19 in the 2 weeks post procedure, please call and report this information to [place].    If any biopsies were taken you will be contacted by phone or by letter within the next 1-3 weeks.  Please call us at [phone] if you have not heard about the biopsies in 3 weeks.    SIGNATURES/CONFIDENTIALITY: You and/or your care partner have signed paperwork which will be entered into your electronic medical record.  These signatures attest to the fact that that the information above on your After Visit Summary has been reviewed and is understood.  Full responsibility of the confidentiality of this discharge information lies with you and/or your care-partner.

## 2020-12-06 NOTE — Progress Notes (Signed)
VS by [place]  Pt's states no medical or surgical changes since previsit or office visit.  

## 2020-12-08 ENCOUNTER — Telehealth: Payer: Self-pay

## 2020-12-08 NOTE — Telephone Encounter (Signed)
  Follow up Call-  Call back number 12/06/2020  Post procedure Call Back phone  # [phone]  Permission to leave phone message Yes  Some recent data might be hidden     Patient questions:  Do you have a fever, pain , or abdominal swelling? No. Pain Score  0 *  Have you tolerated food without any problems? Yes.    Have you been able to return to your normal activities? Yes.    Do you have any questions about your discharge instructions: Diet   No. Medications  No. Follow up visit  No.  Do you have questions or concerns about your Care? No.  Actions: * If pain score is 4 or above: No action needed, pain <4.   Have you developed a fever since your procedure? No   2.   Have you had an respiratory symptoms (SOB or cough) since your procedure? No   3.   Have you tested positive for COVID 19 since your procedure no   4.   Have you had any family members/close contacts diagnosed with the COVID 19 since your procedure?  No    If yes to any of these questions please route to Laverna Peace, RN and Karlton Lemon, RN

## 2021-01-06 ENCOUNTER — Other Ambulatory Visit: Payer: Self-pay | Admitting: Internal Medicine

## 2021-01-06 DIAGNOSIS — F419 Anxiety disorder, unspecified: Secondary | ICD-10-CM

## 2021-01-18 ENCOUNTER — Other Ambulatory Visit: Payer: Self-pay | Admitting: Adult Health

## 2021-01-18 ENCOUNTER — Telehealth: Payer: Self-pay

## 2021-01-18 MED ORDER — IBUPROFEN 800 MG PO TABS: 800.0000 mg | ORAL_TABLET | Freq: Three times a day (TID) | ORAL | 2 refills | Status: DC | PRN

## 2021-01-18 NOTE — Telephone Encounter (Signed)
Patient states that she has been taking 4 tablets of 200mg  of ibuprofen and it has helped her muscle pain. Requesting an Rx for ibuprofen 800mg  that way she only has to take one tablet at a time. Please advise.

## 2021-01-19 NOTE — Telephone Encounter (Signed)
Patient aware.

## 2021-02-08 ENCOUNTER — Other Ambulatory Visit: Payer: Self-pay | Admitting: Adult Health

## 2021-02-08 DIAGNOSIS — F419 Anxiety disorder, unspecified: Secondary | ICD-10-CM

## 2021-03-08 ENCOUNTER — Encounter: Payer: BC Managed Care – PPO | Admitting: Adult Health

## 2021-03-24 ENCOUNTER — Other Ambulatory Visit: Payer: Self-pay | Admitting: Nurse Practitioner

## 2021-03-24 ENCOUNTER — Other Ambulatory Visit: Payer: Self-pay | Admitting: Adult Health Nurse Practitioner

## 2021-03-24 ENCOUNTER — Other Ambulatory Visit: Payer: Self-pay | Admitting: Adult Health

## 2021-03-24 DIAGNOSIS — M62838 Other muscle spasm: Secondary | ICD-10-CM

## 2021-03-24 DIAGNOSIS — M6289 Other specified disorders of muscle: Secondary | ICD-10-CM

## 2021-03-24 DIAGNOSIS — G609 Hereditary and idiopathic neuropathy, unspecified: Secondary | ICD-10-CM

## 2021-03-24 DIAGNOSIS — F419 Anxiety disorder, unspecified: Secondary | ICD-10-CM

## 2021-05-19 ENCOUNTER — Other Ambulatory Visit: Payer: Self-pay | Admitting: Nurse Practitioner

## 2021-05-19 DIAGNOSIS — F419 Anxiety disorder, unspecified: Secondary | ICD-10-CM

## 2021-06-22 DIAGNOSIS — M9903 Segmental and somatic dysfunction of lumbar region: Secondary | ICD-10-CM | POA: Diagnosis not present

## 2021-06-22 DIAGNOSIS — M9905 Segmental and somatic dysfunction of pelvic region: Secondary | ICD-10-CM | POA: Diagnosis not present

## 2021-06-22 DIAGNOSIS — M5116 Intervertebral disc disorders with radiculopathy, lumbar region: Secondary | ICD-10-CM | POA: Diagnosis not present

## 2021-06-22 DIAGNOSIS — M25551 Pain in right hip: Secondary | ICD-10-CM | POA: Diagnosis not present

## 2021-06-24 ENCOUNTER — Encounter: Payer: BC Managed Care – PPO | Admitting: Adult Health

## 2021-06-27 ENCOUNTER — Other Ambulatory Visit: Payer: Self-pay | Admitting: Adult Health

## 2021-06-27 DIAGNOSIS — F419 Anxiety disorder, unspecified: Secondary | ICD-10-CM

## 2021-07-07 ENCOUNTER — Encounter: Payer: Self-pay | Admitting: Adult Health

## 2021-07-08 ENCOUNTER — Other Ambulatory Visit: Payer: Self-pay | Admitting: Adult Health

## 2021-07-08 MED ORDER — FAMOTIDINE 20 MG PO TABS: ORAL_TABLET | ORAL | 2 refills | Status: DC

## 2021-07-15 ENCOUNTER — Encounter: Payer: Self-pay | Admitting: Adult Health

## 2021-07-15 NOTE — Progress Notes (Signed)
Complete Physical  Assessment and Plan:  Encounter for Annual Physical Exam with abnormal findings Due annually  Health Maintenance reviewed Healthy lifestyle reviewed and goals set  Get MGM annually, schedule follow up with GYN  Screening diabetes -     Hemoglobin A1c  Mixed hyperlipidemia Consider med if LDL trending 130+  Continue low cholesterol diet and exercise.  Check lipid panel. -     Lipid panel -     TSH  Vitamin D deficiency -     VITAMIN D 25 Hydroxy (Vit-D Deficiency, Fractures)  Screening for thyroid disorder -     TSH  Screening for hematuria or proteinuria -     Urinalysis, Routine w reflex microscopic -     microalbumin  Hereditary peripheral neuropathy (r/t ALD) Myalgia Muscle spasticity Improved with job change; managing with current meds and lifestyle modification She is considering PT/medical massage this year but declines referral at this visit Lifestyle discussed;  -     baclofen (LIORESAL) 10 MG tablet; Take 1 tablet (10 mg total) by mouth 3 (three) times daily. -     gabapentin (NEURONTIN) 300 MG capsule; TAKE 1-3 CAPSULES BY MOUTH AT BEDTIME AS NEEDED FOR SLEEP AND PAIN  Nausea/acid reflux Nauea improved with famotidine per patient Discussed diet, avoiding triggers and other lifestyle changes  Insomnia/anxiety Per patient request starting trazodone 50-100 mg nightly per orders She will try to taper off on xanax use She will be starting with therapist next week  Declines other daily agent for anxiety at this time Close follow up if needed Stress management techniques discussed, increase water, good sleep hygiene discussed, increase exercise, and increase veggies.     Orders Placed This Encounter  Procedures   MM Digital Screening   CBC with Differential/Platelet   COMPLETE METABOLIC PANEL WITH GFR   Magnesium   Lipid panel   TSH   Hemoglobin A1c   VITAMIN D 25 Hydroxy (Vit-D Deficiency, Fractures)   Microalbumin / creatinine  urine ratio   Urinalysis, Routine w reflex microscopic    Discussed med's effects and SE's. Screening labs and tests as requested with regular follow-up as recommended. Over 40 minutes of exam, counseling, chart review, and critical decision making was performed this visit.   Future Appointments  Date Time Provider [place]  07/19/2022  9:00 AM Liane Comber, NP [facility] None     HPI  51 y.o. female  presents for a complete physical. She has Adrenolekodystrophy (ALD) carrier; Other abnormal glucose (hx of prediabetes) ; Myalgia; Anxiety; Hereditary peripheral neuropathy; Diverticula of colon; and Gastroesophageal reflux disease on their problem list.  She is married, 1 daughter. She follows with [facility], last in ? 2018, due for follow up, will make appointment. She is getting annual mammograms with breast center, last 10/20/2020.   She is intermittently somewhat tearful today. Her parents passed in recent years, daughter on disability due to seizures and lives with her, very sedentary/won't do anything around the house. Patient has had fear of dying and leaving her alone, she reports is scheduled to start seeing a therapist starting next week, "I feel like I just need to get some things off my mind." She reports mood is good during the day at work, just in the evening at home. She has some trouble sleeping, would like to try trazodone instead.   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 She started developing myalgias/weakness/stiffness around 2019 with extensive workups  that were unremarkable, was referred to Dr. Jaynee Eagles. She had EMG studies on 07/31/19 which were unremarkable and was advised sx r/t ALD. She was going to physical therapy, dry needling but with limited temporary benefit and stopped going due to cost. She takes gabapentin at night for sleep and pain; baclofen and PRN alprazolam with some benefit. 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   She has intermittent ongoing nausea. Had unremarkable CBC, CMP/GFR, lipase, [place] in 2022. She was smoking marijuana daily which helped but has quit as of 2 weeks ago. She reports famotidine that was prescribed does seem to be helping. She was evaluated by GI Dr. Loletha Carrow and had colonoscopy 12/06/2020 which was benign other than diverticuloses.   BMI is Body mass index is 21.77 kg/m., she has not been working on diet and exercise. Wt Readings from Last 3 Encounters:  07/19/21 126 lb 12.8 oz (57.5 kg)  12/06/20 126 lb (57.2 kg)  11/09/20 126 lb 9.6 oz (57.4 kg)   Her blood pressure has been controlled at home, today their BP is BP: 112/74 She does not workout. She denies chest pain, shortness of breath, dizziness.   She is not on cholesterol medication and denies myalgias. Her cholesterol is at goal. The cholesterol last visit was:   Lab Results  Component Value Date   CHOL 186 03/04/2020   HDL 77 03/04/2020   LDLCALC 95 03/04/2020   TRIG 58 03/04/2020   CHOLHDL 2.4 03/04/2020   Last A1C in the office was:  Lab Results  Component Value Date   HGBA1C 5.5 03/04/2020   Patient admits hasn't been taking Vitamin D supplement since feeling nauseous. Was taking 1000 IU daily.   Lab Results  Component Value Date   VD25OH 50 03/04/2020        Current Medications:  Current Outpatient Medications on File Prior to Visit  Medication Sig Dispense Refill   acyclovir (ZOVIRAX) 800 MG tablet TAKE ONE TABLET BY MOUTH DAILY AS NEEDED 90 tablet 2   ALPRAZolam (XANAX) 0.5 MG tablet TAKE ONE TABLET BY MOUTH UP TO TWICE DAILY AS NEEDED FOR SEVERE anxiety. limit TO less THAN FIVE DAYS A WEEK to avoid addiction. Do not drive or take with alcohol 45 tablet 0   baclofen (LIORESAL) 10 MG tablet TAKE ONE TABLET BY MOUTH THREE TIMES DAILY 90 tablet 2   Cholecalciferol (VITAMIN D-3 PO) Take 1 capsule by mouth daily.     famotidine (PEPCID) 20 MG tablet  Take 1 tab prior to breakfast and supper for reflux. 60 tablet 2   gabapentin (NEURONTIN) 300 MG capsule TAKE 1-3 CAPSULES by mouth AT BEDTIME AS NEEDED FOR SLEEP AND PAIN 90 capsule 2   ibuprofen (ADVIL) 800 MG tablet Take 1 tablet (800 mg total) by mouth every 8 (eight) hours as needed. Take with food, avoid excess use as stressful for kidneys. 90 tablet 2   Magnesium 250 MG TABS Take 1 tablet by mouth daily. (Patient not taking: Reported on 09/06/2020)     Multiple Vitamins-Minerals (MULTIVITAMIN ADULT) CHEW Chew by mouth. (Patient not taking: Reported on 09/06/2020)     ondansetron (ZOFRAN) 4 MG tablet Take 1 tablet (4 mg total) by mouth every 8 (eight) hours as needed for nausea or vomiting. (Patient not taking: Reported on 09/06/2020) 90 tablet 0   promethazine (PHENERGAN) 25 MG tablet Take 0.5-1 tablets (12.5-25 mg total) by mouth every 6 (six) hours as needed for nausea or vomiting (can cause fatigue). Max:  4 tablets per day (Patient not taking: Reported on 11/09/2020) 60 tablet 0   No current facility-administered medications on file prior to visit.    Health Maintenance:   Immunization History  Administered Date(s) Administered   PFIZER(Purple Top)SARS-COV-2 Vaccination 01/07/2020, 01/28/2020   Tdap 07/30/2013   Health Maintenance  Topic Date Due   PAP SMEAR-Modifier  02/18/2019   COVID-19 Vaccine (3 - Pfizer risk series) 08/04/2021 (Originally 02/25/2020)   INFLUENZA VACCINE  09/16/2021 (Originally 01/17/2021)   Zoster Vaccines- Shingrix (1 of 2) 10/16/2021 (Originally 11/10/1987)   MAMMOGRAM  10/20/2021   TETANUS/TDAP  07/31/2023   COLONOSCOPY (Pts 45-60yrs Insurance coverage will need to be confirmed)  12/07/2030   Hepatitis C Screening  Completed   HPV VACCINES  Aged Out   HIV Screening  Discontinued   Pap: ? 2018/2019 with GYN history of abnormal pap 20+ years ago, normal since then, advised to follow up, she states will schedule this year MGM: breast center, last 10/2020 DEXA:  N/A  Colonoscopy: 12/06/2020, Dr. Loletha Carrow, 10 year recall, diverticulosis EGD: N/A  Last eye: several years, using readers, will schedule this year per patient  Patient Care Team: Unk Pinto, MD as PCP - General (Internal Medicine)  Allergies: No Known Allergies Medical History:  Past Medical History:  Diagnosis Date   Adrenolekodystrophy (ALD) carrier 09/21/2017   Adrenoleukodystrophy (ALD) carrier   Anxiety    DDD (degenerative disc disease)    Diverticulosis    GERD (gastroesophageal reflux disease)    Neuromuscular disorder ([place])    neuropathy   Surgical History:  Past Surgical History:  Procedure Laterality Date   SPINE SURGERY  2007   L4-L5   TUBAL LIGATION     Family History:  Family History  Problem Relation Age of Onset   Pancreatic cancer Mother 47   Lung cancer Father 57       smoker   Melanoma Sister    Other Maternal Grandmother        ADL carrier   Lung cancer Paternal Grandmother        smoker   Seizures Daughter    Other Maternal Uncle 12       ADL   Cancer Paternal Uncle    Melanoma Paternal Uncle    Colon cancer Neg Hx    Colon polyps Neg Hx    Esophageal cancer Neg Hx    Rectal cancer Neg Hx    Stomach cancer Neg Hx    Social History:  Social History   Tobacco Use   Smoking status: Never   Smokeless tobacco: Never  Vaping Use   Vaping Use: Never used  Substance Use Topics   Alcohol use: Not Currently    Comment: very rare   Drug use: Yes    Types: Marijuana    Comment: last 2 weeks ago    Review of Systems: Review of Systems  Constitutional:  Negative for chills, diaphoresis, fever, malaise/fatigue and weight loss.  HENT:  Positive for hearing loss (chronic, mild stable; has seen ENT) and tinnitus (chronic, bil, non-pulsatile). Negative for congestion, ear discharge, ear pain, nosebleeds and sore throat.   Eyes: Negative.  Negative for blurred vision and double vision.  Respiratory: Negative.  Negative for cough, shortness  of breath, wheezing and stridor.   Cardiovascular: Negative.  Negative for chest pain, palpitations, orthopnea, claudication and leg swelling.  Gastrointestinal:  Positive for nausea (intermittent, improving with famotidine). Negative for abdominal pain, blood in stool, constipation, diarrhea, heartburn, melena and vomiting.  Genitourinary: Negative.   Musculoskeletal:  Positive for back pain (chronic) and myalgias. Negative for falls, joint pain and neck pain.  Skin: Negative.  Negative for rash.  Neurological:  Positive for tingling (bil feet, chronic). Negative for dizziness, tremors, sensory change, speech change, focal weakness, seizures, loss of consciousness, weakness and headaches.       Decreased sensation bil feet  Endo/Heme/Allergies:  Negative for polydipsia.  Psychiatric/Behavioral:  Negative for depression, hallucinations, memory loss, substance abuse and suicidal ideas. The patient has insomnia. The patient is not nervous/anxious.   All other systems reviewed and are negative.  Physical Exam: Estimated body mass index is 21.77 kg/m as calculated from the following:   Height as of this encounter: 5\' 4"  (1.626 m).   Weight as of this encounter: 126 lb 12.8 oz (57.5 kg). BP 112/74    Pulse (!) 113    Temp (!) 97.5 F (36.4 C)    Ht 5\' 4"  (1.626 m)    Wt 126 lb 12.8 oz (57.5 kg)    SpO2 99%    BMI 21.77 kg/m  General Appearance: Well nourished, in no apparent distress.  Eyes: PERRLA, EOMs, conjunctiva no swelling or erythema Sinuses: No Frontal/maxillary tenderness  ENT/Mouth: Ext aud canals clear, normal light reflex with TMs without erythema, bulging. Good dentition. No erythema, swelling, or exudate on post pharynx. Tonsils not swollen or erythematous. Hearing normal.  Neck: Supple, thyroid normal. No bruits  Respiratory: Respiratory effort normal, BS equal bilaterally without rales, rhonchi, wheezing or stridor.  Cardio: RRR without murmurs, rubs or gallops. Brisk peripheral  pulses without edema.  Chest: symmetric, with normal excursions and percussion.  Breasts: Breasts: breasts appear normal, no suspicious masses, no skin or nipple changes or axillary nodes Abdomen: Soft, mild epigastric tenderness, no guarding, rebound, hernias, masses, or organomegaly.  Lymphatics: Non tender without lymphadenopathy.  Genitourinary: defer to GYN Musculoskeletal: Symmetrical ROM all peripheral extremities, some limitations in shoulders bil, improved with passive, 5/5 strength but with spacticity against resistance,  normal gait.  Skin: Warm, dry without rashes, lesions, ecchymosis.  Neuro: Cranial nerves intact, reflexes equal bilaterally. Normal muscle tone, no cerebellar symptoms. Sensation dulled to monofilament bil feet and ankles  Psych: Awake and oriented X 3, normal affect, Insight and Judgment appropriate.   EKG: WNL in 2021, low risk, defer  Izora Ribas 9:58 AM [facility]

## 2021-07-19 ENCOUNTER — Ambulatory Visit (INDEPENDENT_AMBULATORY_CARE_PROVIDER_SITE_OTHER): Payer: BC Managed Care – PPO | Admitting: Adult Health

## 2021-07-19 ENCOUNTER — Encounter: Payer: Self-pay | Admitting: Adult Health

## 2021-07-19 ENCOUNTER — Other Ambulatory Visit: Payer: Self-pay

## 2021-07-19 VITALS — BP 112/74 | HR 82 | Temp 97.5°F | Ht 64.0 in | Wt 126.8 lb

## 2021-07-19 DIAGNOSIS — G609 Hereditary and idiopathic neuropathy, unspecified: Secondary | ICD-10-CM

## 2021-07-19 DIAGNOSIS — K5730 Diverticulosis of large intestine without perforation or abscess without bleeding: Secondary | ICD-10-CM | POA: Insufficient documentation

## 2021-07-19 DIAGNOSIS — Z79899 Other long term (current) drug therapy: Secondary | ICD-10-CM | POA: Diagnosis not present

## 2021-07-19 DIAGNOSIS — Z148 Genetic carrier of other disease: Secondary | ICD-10-CM

## 2021-07-19 DIAGNOSIS — Z1389 Encounter for screening for other disorder: Secondary | ICD-10-CM | POA: Diagnosis not present

## 2021-07-19 DIAGNOSIS — K219 Gastro-esophageal reflux disease without esophagitis: Secondary | ICD-10-CM

## 2021-07-19 DIAGNOSIS — F419 Anxiety disorder, unspecified: Secondary | ICD-10-CM | POA: Diagnosis not present

## 2021-07-19 DIAGNOSIS — G4700 Insomnia, unspecified: Secondary | ICD-10-CM

## 2021-07-19 DIAGNOSIS — R6889 Other general symptoms and signs: Secondary | ICD-10-CM | POA: Diagnosis not present

## 2021-07-19 DIAGNOSIS — E559 Vitamin D deficiency, unspecified: Secondary | ICD-10-CM

## 2021-07-19 DIAGNOSIS — Z1329 Encounter for screening for other suspected endocrine disorder: Secondary | ICD-10-CM

## 2021-07-19 DIAGNOSIS — M791 Myalgia, unspecified site: Secondary | ICD-10-CM

## 2021-07-19 DIAGNOSIS — Z13220 Encounter for screening for lipoid disorders: Secondary | ICD-10-CM | POA: Diagnosis not present

## 2021-07-19 DIAGNOSIS — Z131 Encounter for screening for diabetes mellitus: Secondary | ICD-10-CM

## 2021-07-19 DIAGNOSIS — Z0001 Encounter for general adult medical examination with abnormal findings: Secondary | ICD-10-CM

## 2021-07-19 DIAGNOSIS — R7309 Other abnormal glucose: Secondary | ICD-10-CM

## 2021-07-19 DIAGNOSIS — Z1231 Encounter for screening mammogram for malignant neoplasm of breast: Secondary | ICD-10-CM

## 2021-07-19 MED ORDER — TRAZODONE HCL 50 MG PO TABS: ORAL_TABLET | ORAL | 0 refills | Status: DC

## 2021-07-19 NOTE — Patient Instructions (Signed)
Ms. Dross , Thank you for taking time to come for your Annual Wellness Visit. I appreciate your ongoing commitment to your health goals. Please review the following plan we discussed and let me know if I can assist you in the future.   This is a list of the screening recommended for you and due dates:  Health Maintenance  Topic Date Due   Pap Smear  02/18/2019   COVID-19 Vaccine (3 - Pfizer risk series) 08/04/2021*   Flu Shot  09/16/2021*   Zoster (Shingles) Vaccine (1 of 2) 10/16/2021*   Mammogram  10/20/2021   Tetanus Vaccine  07/31/2023   Colon Cancer Screening  12/07/2030   Hepatitis C Screening: USPSTF Recommendation to screen - Ages 18-79 yo.  Completed   HPV Vaccine  Aged Out   HIV Screening  Discontinued  *Topic was postponed. The date shown is not the original due date.     Know what a healthy weight is for you (roughly BMI <25) and aim to maintain this  Aim for 7+ servings of fruits and vegetables daily  65-80+ fluid ounces of water or unsweet tea for healthy kidneys  Limit to max 1 drink of alcohol per day; avoid smoking/tobacco  Limit animal fats in diet for cholesterol and heart health - choose grass fed whenever available  Avoid highly processed foods, and foods high in saturated/trans fats  Aim for low stress - take time to unwind and care for your mental health  Aim for 150 min of moderate intensity exercise weekly for heart health, and weights twice weekly for bone health  Aim for 7-9 hours of sleep daily     Food Choices for Gastroesophageal Reflux Disease, Adult When you have gastroesophageal reflux disease (GERD), the foods you eat and your eating habits are very important. Choosing the right foods can help ease your discomfort. Think about working with a food expert (dietitian) to help you make good choices. What are tips for following this plan? Reading food labels Look for foods that are low in saturated fat. Foods that may help with your symptoms  include: Foods that have less than 5% of daily value (DV) of fat. Foods that have 0 grams of trans fat. Cooking Do not fry your food. Cook your food by baking, steaming, grilling, or broiling. These are all methods that do not need a lot of fat for cooking. To add flavor, try to use herbs that are low in spice and acidity. Meal planning  Choose healthy foods that are low in fat, such as: Fruits and vegetables. Whole grains. Low-fat dairy products. Lean meats, fish, and poultry. Eat small meals often instead of eating 3 large meals each day. Eat your meals slowly in a place where you are relaxed. Avoid bending over or lying down until 2-3 hours after eating. Limit high-fat foods such as fatty meats or fried foods. Limit your intake of fatty foods, such as oils, butter, and shortening. Avoid the following as told by your doctor: Foods that cause symptoms. These may be different for different people. Keep a food diary to keep track of foods that cause symptoms. Alcohol. Drinking a lot of liquid with meals. Eating meals during the 2-3 hours before bed. Lifestyle Stay at a healthy weight. Ask your doctor what weight is healthy for you. If you need to lose weight, work with your doctor to do so safely. Exercise for at least 30 minutes on 5 or more days each week, or as told by your  doctor. Wear loose-fitting clothes. Do not smoke or use any products that contain nicotine or tobacco. If you need help quitting, ask your doctor. Sleep with the head of your bed higher than your feet. Use a wedge under the mattress or blocks under the bed frame to raise the head of the bed. Chew sugar-free gum after meals. What foods should eat? Eat a healthy, well-balanced diet of fruits, vegetables, whole grains, low-fat dairy products, lean meats, fish, and poultry. Each person is different. Foods that may cause symptoms in one person may not cause any symptoms in another person. Work with your doctor to find  foods that are safe for you. The items listed above may not be a complete list of what you can eat and drink. Contact a food expert for more options. What foods should I avoid? Limiting some of these foods may help in managing the symptoms of GERD. Everyone is different. Talk with a food expert or your doctor to help you find the exact foods to avoid, if any. Fruits Any fruits prepared with added fat. Any fruits that cause symptoms. For some people, this may include citrus fruits, such as oranges, grapefruit, pineapple, and lemons. Vegetables Deep-fried vegetables. [place] fries. Any vegetables prepared with added fat. Any vegetables that cause symptoms. For some people, this may include tomatoes and tomato products, chili peppers, onions and garlic, and horseradish. Grains Pastries or quick breads with added fat. Meats and other proteins High-fat meats, such as fatty beef or pork, hot dogs, ribs, ham, sausage, salami, and bacon. Fried meat or protein, including fried fish and fried chicken. Nuts and nut butters, in large amounts. Dairy Whole milk and chocolate milk. Sour cream. Cream. Ice cream. Cream cheese. Milkshakes. Fats and oils Butter. Margarine. Shortening. Ghee. Beverages Coffee and tea, with or without caffeine. Carbonated beverages. Sodas. Energy drinks. Fruit juice made with acidic fruits, such as orange or grapefruit. Tomato juice. Alcoholic drinks. Sweets and desserts Chocolate and cocoa. Donuts. Seasonings and condiments Pepper. Peppermint and spearmint. Added salt. Any condiments, herbs, or seasonings that cause symptoms. For some people, this may include curry, hot sauce, or vinegar-based salad dressings. The items listed above may not be a complete list of what you should not eat and drink. Contact a food expert for more options. Questions to ask your doctor Diet and lifestyle changes are often the first steps that are taken to manage symptoms of GERD. If diet and lifestyle  changes do not help, talk with your doctor about taking medicines. Where to find more information International Foundation for Gastrointestinal Disorders: aboutgerd.org Summary When you have GERD, food and lifestyle choices are very important in easing your symptoms. Eat small meals often instead of 3 large meals a day. Eat your meals slowly and in a place where you are relaxed. Avoid bending over or lying down until 2-3 hours after eating. Limit high-fat foods such as fatty meats or fried foods. This information is not intended to replace advice given to you by your health care provider. Make sure you discuss any questions you have with your health care provider. Document Revised: 12/15/2019 Document Reviewed: 12/15/2019 Elsevier Patient Education  [place].

## 2021-07-20 ENCOUNTER — Other Ambulatory Visit: Payer: Self-pay | Admitting: Adult Health

## 2021-07-20 DIAGNOSIS — D72818 Other decreased white blood cell count: Secondary | ICD-10-CM

## 2021-07-20 LAB — COMPLETE METABOLIC PANEL WITH GFR
AG Ratio: 1.8 (calc) (ref 1.0–2.5)
ALT: 12 U/L (ref 6–29)
AST: 17 U/L (ref 10–35)
Albumin: 4.7 g/dL (ref 3.6–5.1)
Alkaline phosphatase (APISO): 77 U/L (ref 37–153)
BUN: 14 mg/dL (ref 7–25)
CO2: 30 mmol/L (ref 20–32)
Calcium: 10.2 mg/dL (ref 8.6–10.4)
Chloride: 103 mmol/L (ref 98–110)
Creat: 0.66 mg/dL (ref 0.50–1.03)
Globulin: 2.6 g/dL (calc) (ref 1.9–3.7)
Glucose, Bld: 97 mg/dL (ref 65–99)
Potassium: 4.6 mmol/L (ref 3.5–5.3)
Sodium: 139 mmol/L (ref 135–146)
Total Bilirubin: 0.5 mg/dL (ref 0.2–1.2)
Total Protein: 7.3 g/dL (ref 6.1–8.1)
eGFR: 105 mL/min/{1.73_m2} (ref >=60)

## 2021-07-20 LAB — CBC WITH DIFFERENTIAL/PLATELET
Absolute Monocytes: 192 cells/uL — ABNORMAL LOW (ref 200–950)
Basophils Absolute: 51 cells/uL (ref 0–200)
Basophils Relative: 1.6 %
Eosinophils Absolute: 42 cells/uL (ref 15–500)
Eosinophils Relative: 1.3 %
HCT: 42.9 % (ref 35.0–45.0)
Hemoglobin: 14.1 g/dL (ref 11.7–15.5)
Lymphs Abs: 1373 cells/uL (ref 850–3900)
MCH: 27.9 pg (ref 27.0–33.0)
MCHC: 32.9 g/dL (ref 32.0–36.0)
MCV: 84.8 fL (ref 80.0–100.0)
MPV: 10 fL (ref 7.5–12.5)
Monocytes Relative: 6 %
Neutro Abs: 1542 cells/uL (ref 1500–7800)
Neutrophils Relative %: 48.2 %
Platelets: 237 10*3/uL (ref 140–400)
RBC: 5.06 10*6/uL (ref 3.80–5.10)
RDW: 13.3 % (ref 11.0–15.0)
Total Lymphocyte: 42.9 %
WBC: 3.2 10*3/uL — ABNORMAL LOW (ref 3.8–10.8)

## 2021-07-20 LAB — URINALYSIS, ROUTINE W REFLEX MICROSCOPIC
Bilirubin Urine: NEGATIVE
Glucose, UA: NEGATIVE
Hgb urine dipstick: NEGATIVE
Leukocytes,Ua: NEGATIVE
Nitrite: NEGATIVE
Protein, ur: NEGATIVE
Specific Gravity, Urine: 1.021 (ref 1.001–1.035)
pH: 5.5 (ref 5.0–8.0)

## 2021-07-20 LAB — HEMOGLOBIN A1C
Hgb A1c MFr Bld: 5.6 % of total Hgb (ref <5.7)
Mean Plasma Glucose: 114 mg/dL
eAG (mmol/L): 6.3 mmol/L

## 2021-07-20 LAB — LIPID PANEL
Cholesterol: 209 mg/dL — ABNORMAL HIGH (ref <200)
HDL: 72 mg/dL (ref >=50)
LDL Cholesterol (Calc): 123 mg/dL (calc) — ABNORMAL HIGH
Non-HDL Cholesterol (Calc): 137 mg/dL (calc) — ABNORMAL HIGH (ref <130)
Total CHOL/HDL Ratio: 2.9 (calc) (ref <5.0)
Triglycerides: 58 mg/dL (ref <150)

## 2021-07-20 LAB — MICROALBUMIN / CREATININE URINE RATIO
Creatinine, Urine: 143 mg/dL (ref 20–275)
Microalb Creat Ratio: 3 mcg/mg creat (ref <30)
Microalb, Ur: 0.5 mg/dL

## 2021-07-20 LAB — TSH: TSH: 1.03 mIU/L

## 2021-07-20 LAB — VITAMIN D 25 HYDROXY (VIT D DEFICIENCY, FRACTURES): Vit D, 25-Hydroxy: 34 ng/mL (ref 30–100)

## 2021-07-20 LAB — MAGNESIUM: Magnesium: 2.2 mg/dL (ref 1.5–2.5)

## 2021-07-20 MED ORDER — VITAMIN D-3 25 MCG (1000 UT) PO CAPS: 1.0000 | ORAL_CAPSULE | Freq: Every day | ORAL | Status: AC

## 2021-07-22 ENCOUNTER — Other Ambulatory Visit: Payer: Self-pay | Admitting: Adult Health

## 2021-07-22 DIAGNOSIS — R110 Nausea: Secondary | ICD-10-CM

## 2021-08-01 ENCOUNTER — Other Ambulatory Visit: Payer: Self-pay | Admitting: Nurse Practitioner

## 2021-08-01 ENCOUNTER — Other Ambulatory Visit: Payer: Self-pay | Admitting: Adult Health

## 2021-08-01 DIAGNOSIS — F419 Anxiety disorder, unspecified: Secondary | ICD-10-CM

## 2021-08-04 ENCOUNTER — Other Ambulatory Visit: Payer: Self-pay | Admitting: Adult Health

## 2021-08-04 DIAGNOSIS — R110 Nausea: Secondary | ICD-10-CM

## 2021-08-22 ENCOUNTER — Other Ambulatory Visit: Payer: Self-pay | Admitting: Nurse Practitioner

## 2021-08-22 DIAGNOSIS — M62838 Other muscle spasm: Secondary | ICD-10-CM

## 2021-08-30 IMAGING — MG MM DIGITAL SCREENING BILAT W/ TOMO AND CAD
8 series · 9 of 24 positions shown · non-contrast
Comparison: Previous exam(s).

CLINICAL DATA: Screening.

EXAM:
DIGITAL SCREENING BILATERAL MAMMOGRAM WITH TOMOSYNTHESIS AND CAD
TECHNIQUE: Bilateral screening digital craniocaudal and mediolateral oblique
mammograms were obtained. Bilateral screening digital breast
tomosynthesis was performed. The images were evaluated with
computer-aided detection.

[R MLO synth-2D]
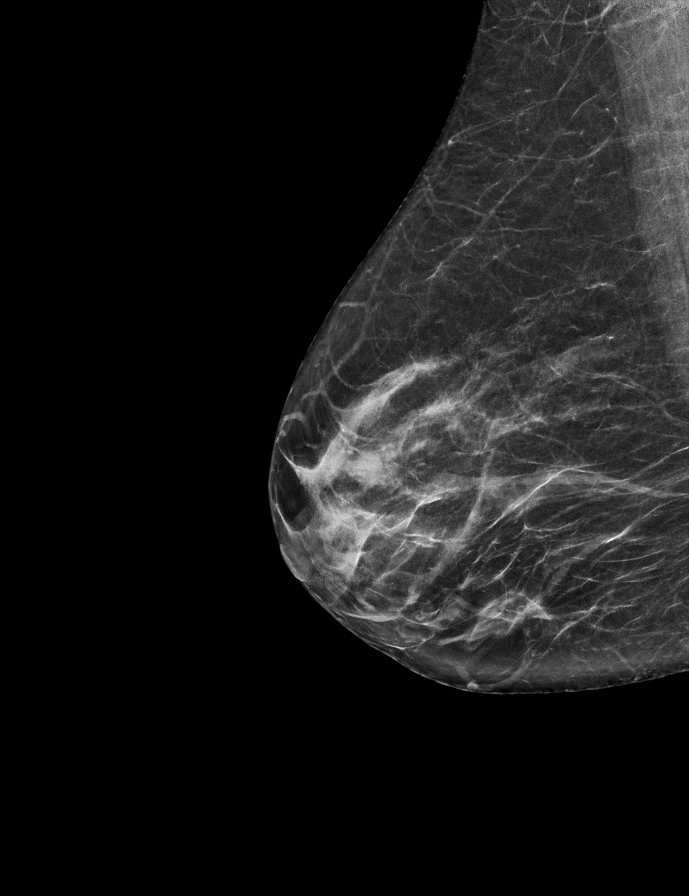

[L MLO synth-2D]
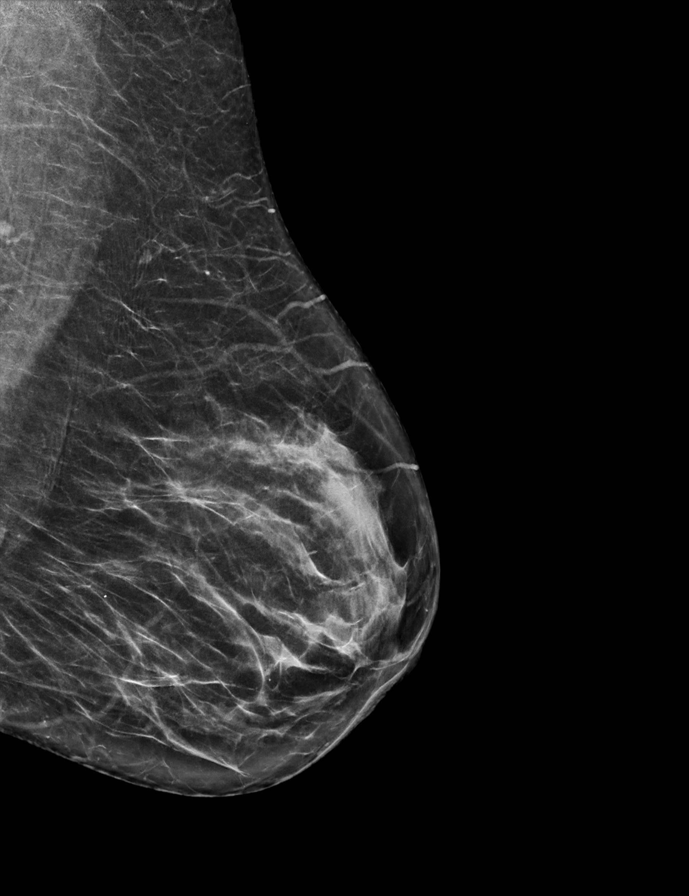

[R CC synth-2D]
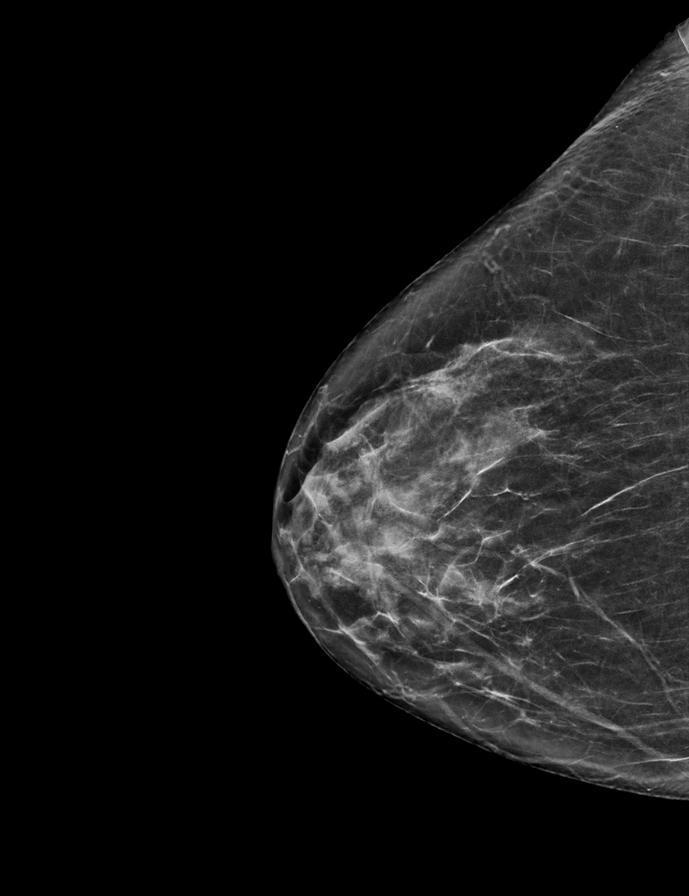

[L CC synth-2D]
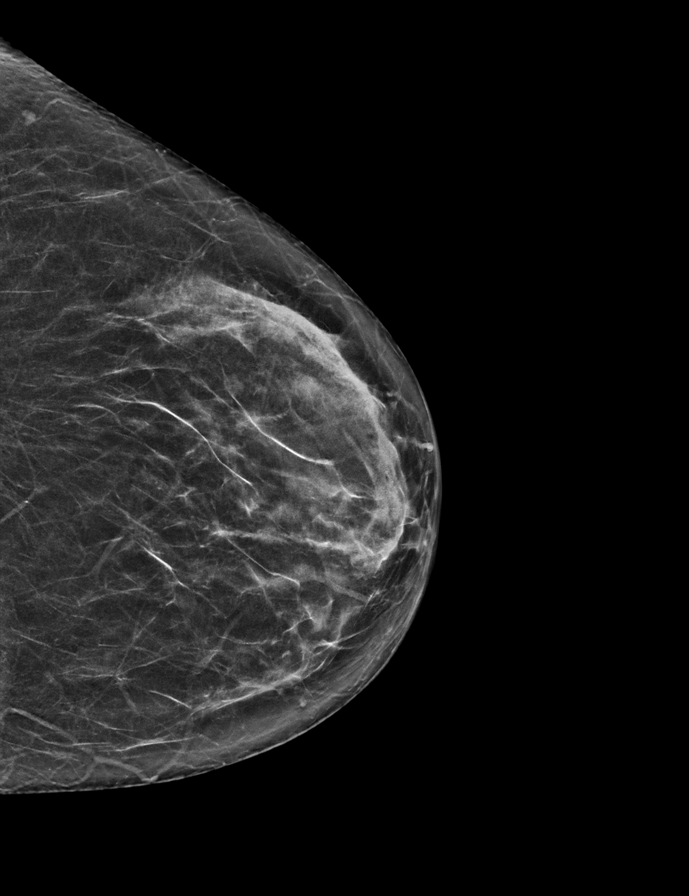

[L CC tomo · 2 of 55 frames shown]
[frame 18/55]
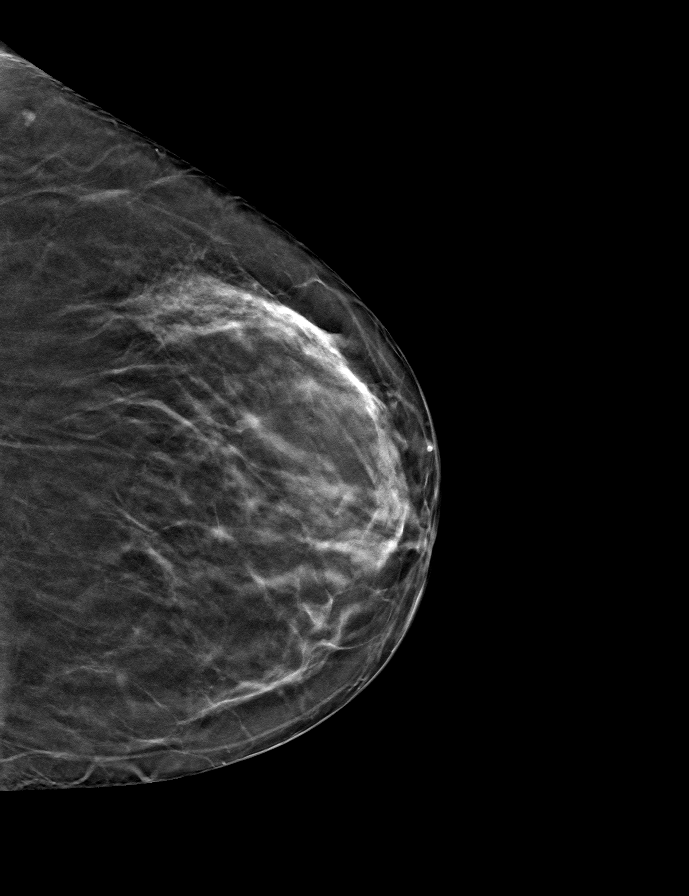
[frame 28/55]
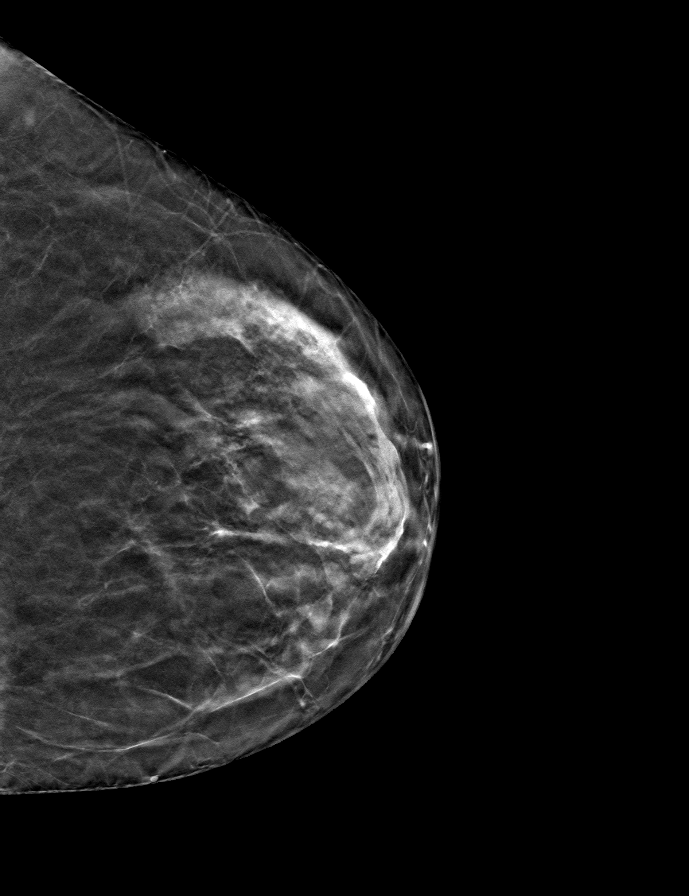

[L MLO tomo · tomo slice 32/63.0]
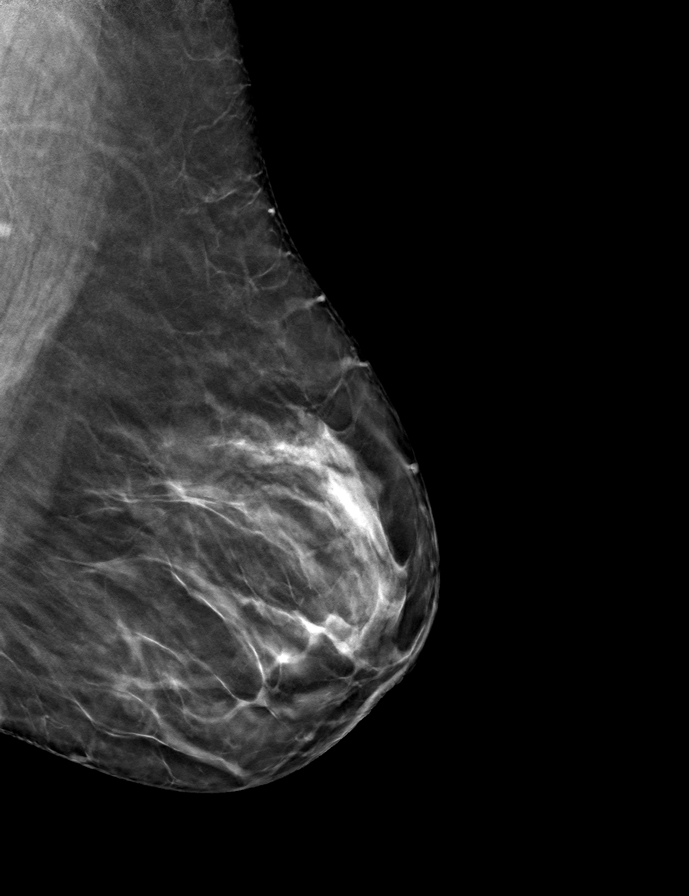

[R CC tomo · tomo slice 31/60.0]
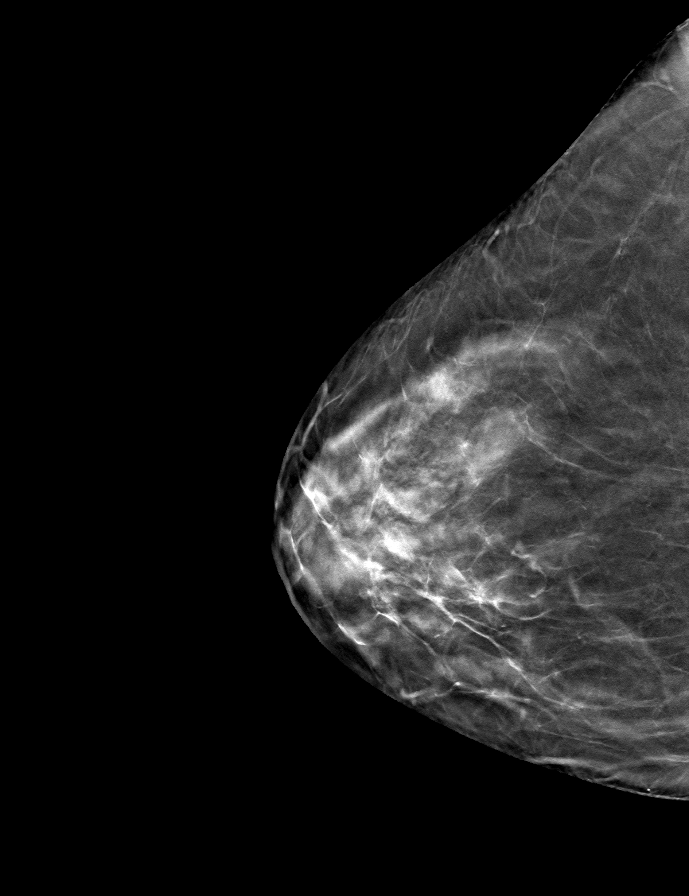

[R MLO tomo · tomo slice 30/59.0]
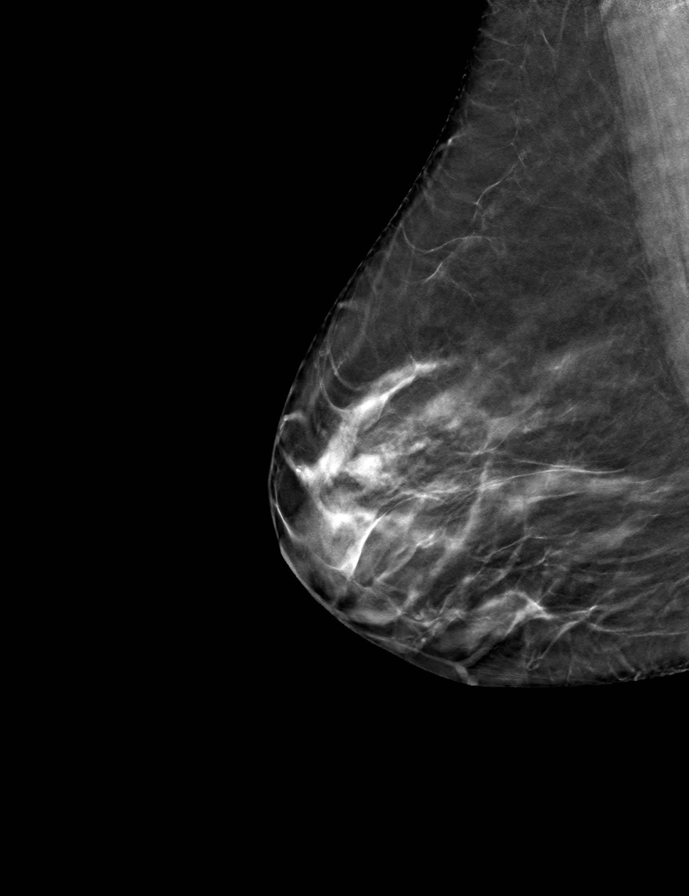

[9 of 24 positions shown; findings below may reference images not displayed]

ACR Breast Density Category c: The breast tissue is heterogeneously
dense, which may obscure small masses.
FINDINGS: There are no findings suspicious for malignancy. The images were
evaluated with computer-aided detection.
IMPRESSION: No mammographic evidence of malignancy. A result letter of this
screening mammogram will be mailed directly to the patient.

RECOMMENDATION:
Screening mammogram in one year. (Code:T4-5-GWO)

BI-RADS CATEGORY  1: Negative.

## 2021-08-31 ENCOUNTER — Other Ambulatory Visit (INDEPENDENT_AMBULATORY_CARE_PROVIDER_SITE_OTHER): Payer: BC Managed Care – PPO

## 2021-08-31 ENCOUNTER — Ambulatory Visit: Payer: BC Managed Care – PPO | Admitting: Nurse Practitioner

## 2021-08-31 ENCOUNTER — Other Ambulatory Visit: Payer: Self-pay

## 2021-08-31 DIAGNOSIS — D72818 Other decreased white blood cell count: Secondary | ICD-10-CM

## 2021-08-31 LAB — CBC WITH DIFFERENTIAL/PLATELET
Absolute Monocytes: 359 cells/uL (ref 200–950)
Basophils Absolute: 51 cells/uL (ref 0–200)
Basophils Relative: 1.1 %
Eosinophils Absolute: 60 cells/uL (ref 15–500)
Eosinophils Relative: 1.3 %
HCT: 40 % (ref 35.0–45.0)
Hemoglobin: 13 g/dL (ref 11.7–15.5)
Lymphs Abs: 1697 cells/uL (ref 850–3900)
MCH: 27.7 pg (ref 27.0–33.0)
MCHC: 32.5 g/dL (ref 32.0–36.0)
MCV: 85.3 fL (ref 80.0–100.0)
MPV: 9.9 fL (ref 7.5–12.5)
Monocytes Relative: 7.8 %
Neutro Abs: 2433 cells/uL (ref 1500–7800)
Neutrophils Relative %: 52.9 %
Platelets: 276 10*3/uL (ref 140–400)
RBC: 4.69 10*6/uL (ref 3.80–5.10)
RDW: 13.1 % (ref 11.0–15.0)
Total Lymphocyte: 36.9 %
WBC: 4.6 10*3/uL (ref 3.8–10.8)

## 2021-09-24 ENCOUNTER — Other Ambulatory Visit: Payer: Self-pay | Admitting: Nurse Practitioner

## 2021-09-24 DIAGNOSIS — F419 Anxiety disorder, unspecified: Secondary | ICD-10-CM

## 2021-10-03 IMAGING — US US ABDOMEN COMPLETE
1 series · 14 of 25 positions shown · non-contrast
Comparison: None.

CLINICAL DATA: Nausea, epigastric abdominal pain

EXAM:
ABDOMEN ULTRASOUND COMPLETE

[Series 1: us abdomen complete · 0.20mm/px · 14 of 127 slices shown]
[im 1/127]
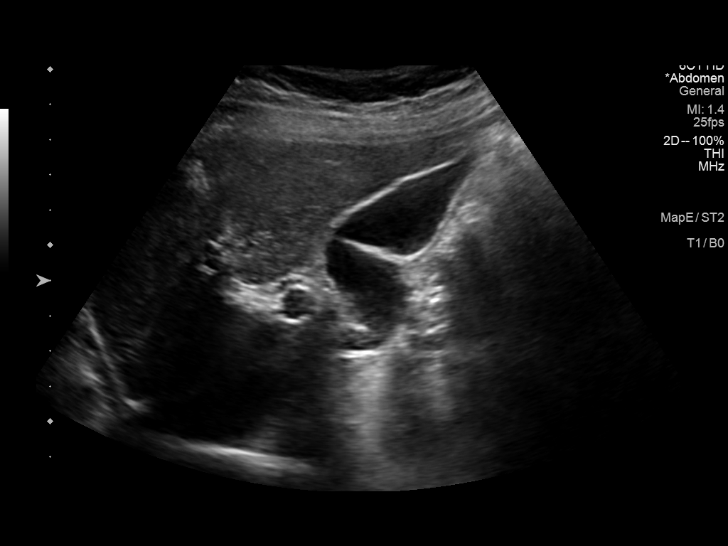
[im 11/127]
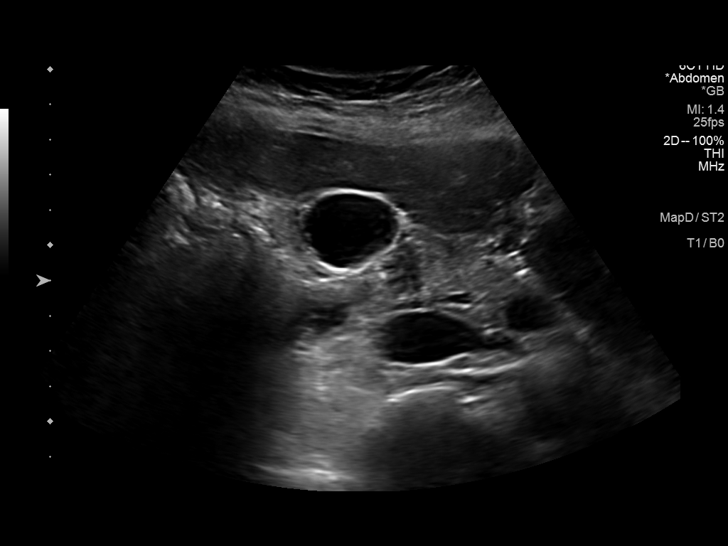
[im 22/127]
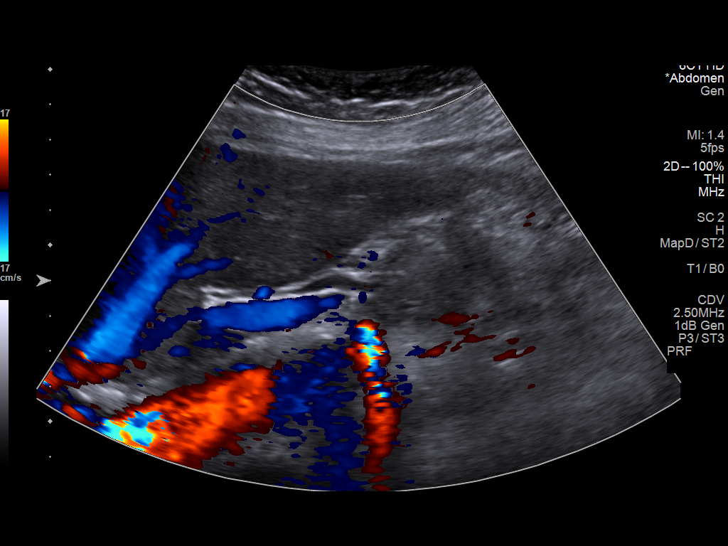
[im 32/127]
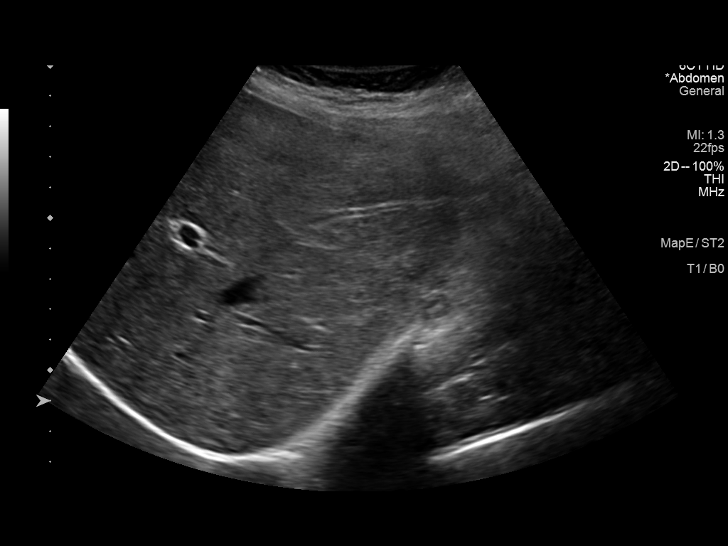
[im 43/127]
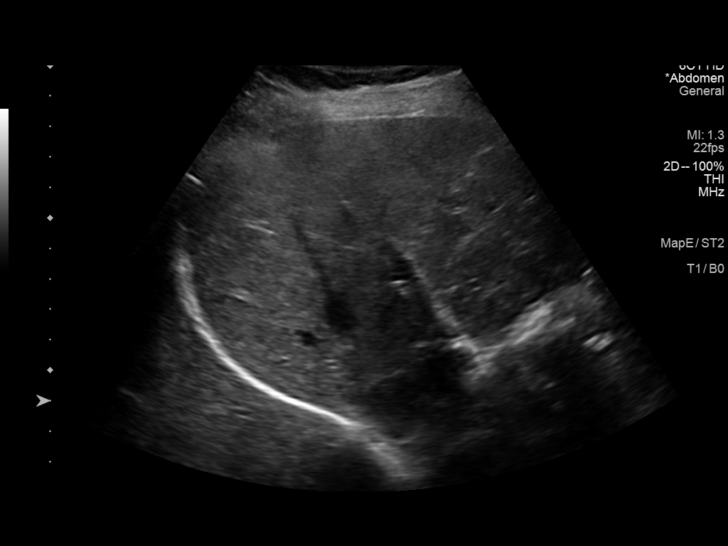
[im 48/127]
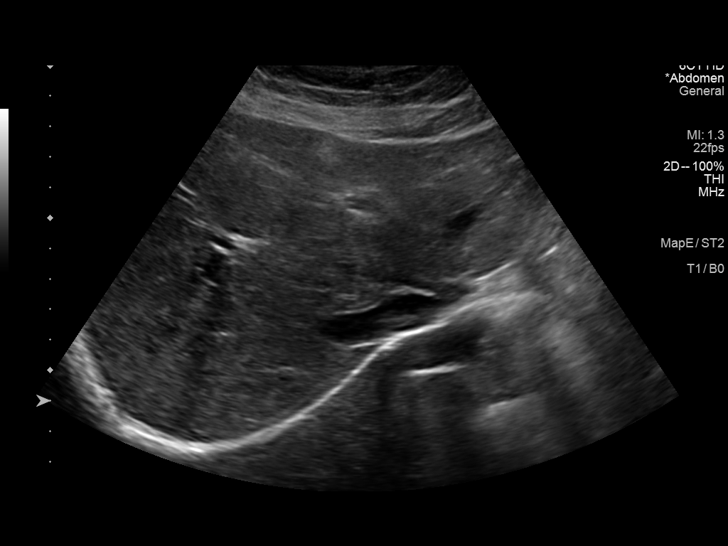
[im 58/127]
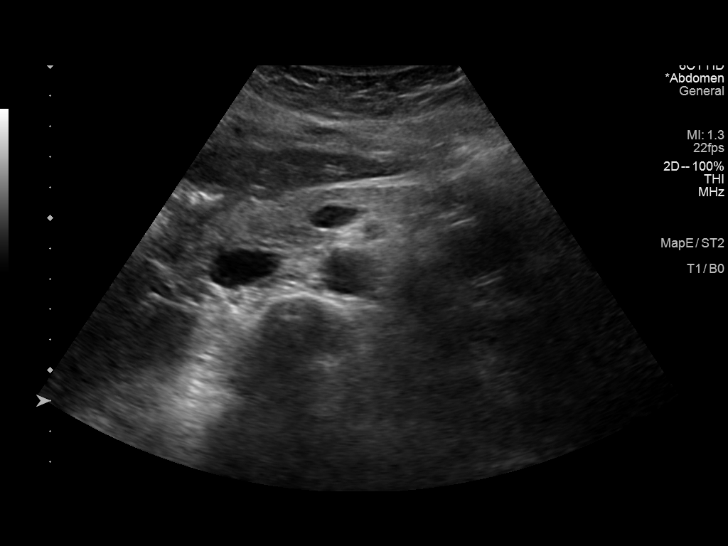
[im 69/127]
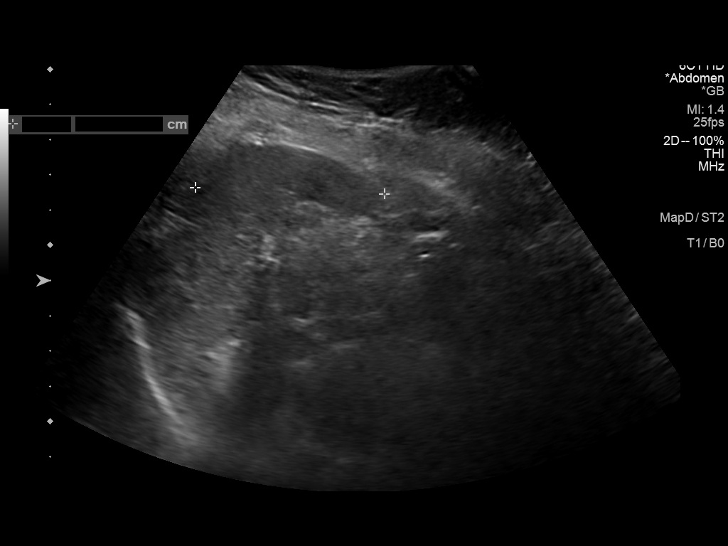
[im 79/127]
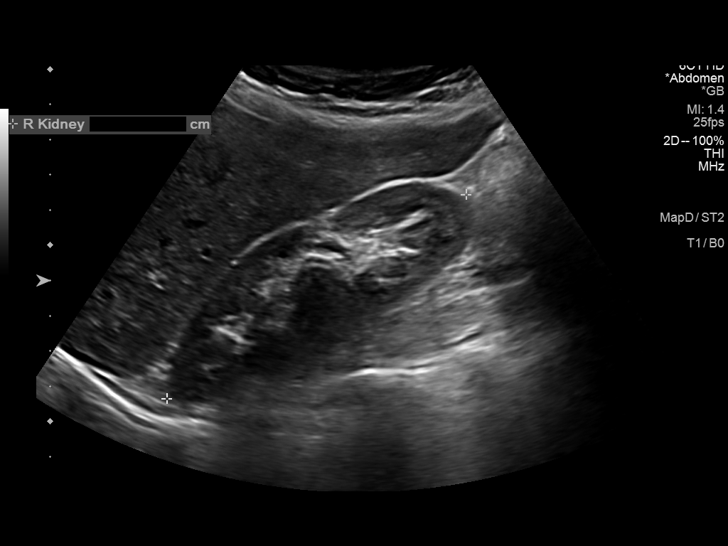
[im 85/127]
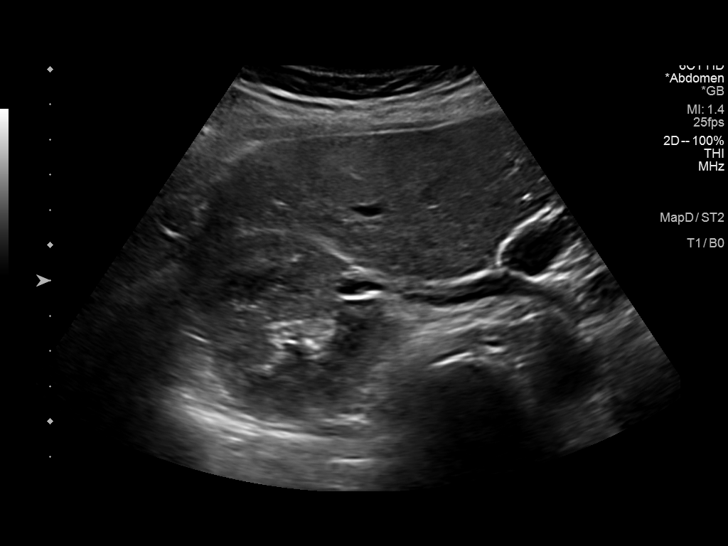
[im 95/127]
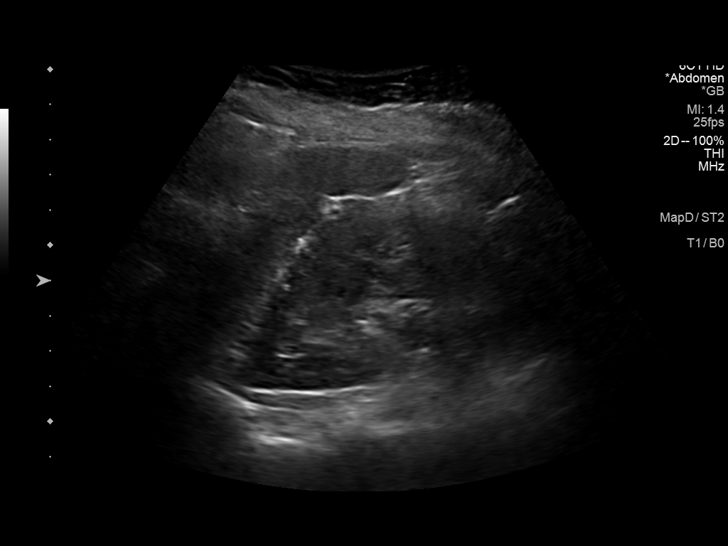
[im 106/127]
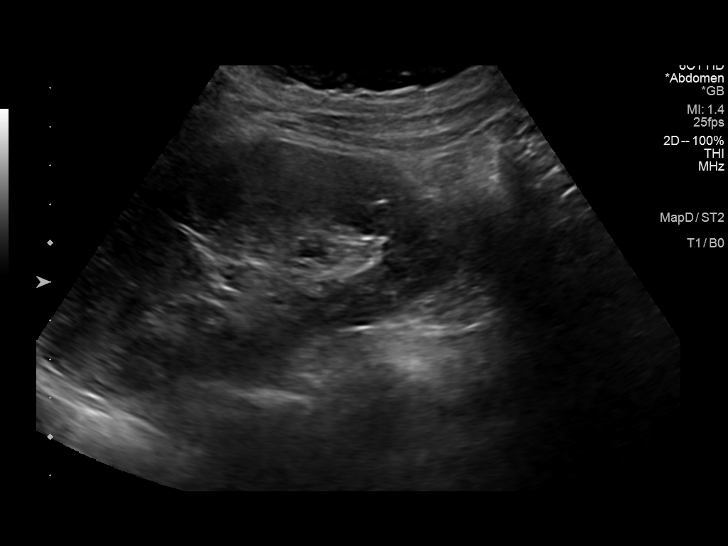
[im 116/127]
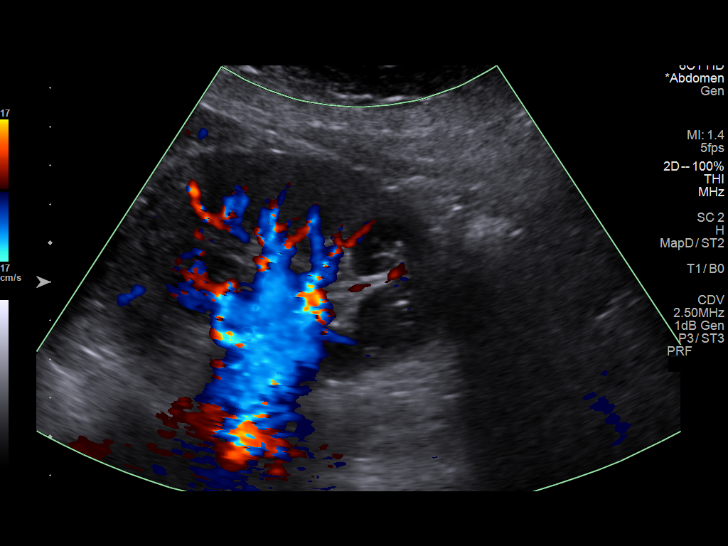
[im 127/127]
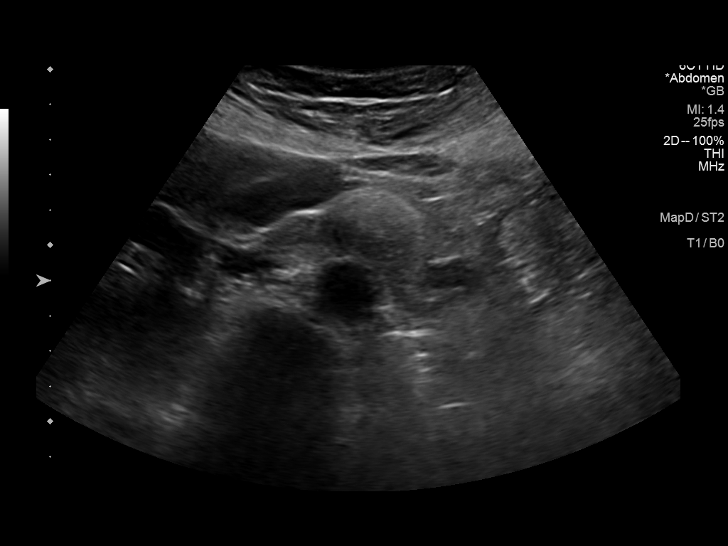

[14 of 25 positions shown; findings below may reference images not displayed]

FINDINGS: Gallbladder: No gallstones or wall thickening visualized. No
sonographic Murphy sign noted by sonographer.

Common bile duct: Diameter: 5 mm in proximal diameter

Liver: No focal lesion identified. Within normal limits in
parenchymal echogenicity. Portal vein is patent on color Doppler
imaging with normal direction of blood flow towards the liver.

IVC: No abnormality visualized.

Pancreas: Visualized portion unremarkable.

Spleen: Size and appearance within normal limits.

Right Kidney: Length: 10.3 cm. Echogenicity within normal limits. No
mass or hydronephrosis visualized.

Left Kidney: Length: 11.0 cm. Echogenicity within normal limits. No
mass or hydronephrosis visualized. Measured lesions within the lower
pole of the left kidney likely represent prominent renal sinus fat
and renal pyramids.

Abdominal aorta: No aneurysm visualized.

Other findings: None.
IMPRESSION: Normal abdominal sonogram

## 2021-10-06 ENCOUNTER — Other Ambulatory Visit: Payer: Self-pay | Admitting: Nurse Practitioner

## 2021-10-06 DIAGNOSIS — G609 Hereditary and idiopathic neuropathy, unspecified: Secondary | ICD-10-CM

## 2021-10-24 ENCOUNTER — Ambulatory Visit: Payer: BC Managed Care – PPO

## 2021-10-26 ENCOUNTER — Other Ambulatory Visit: Payer: Self-pay | Admitting: Nurse Practitioner

## 2021-10-26 DIAGNOSIS — F419 Anxiety disorder, unspecified: Secondary | ICD-10-CM

## 2021-11-30 ENCOUNTER — Ambulatory Visit: Payer: 59 | Admitting: Adult Health

## 2021-12-12 ENCOUNTER — Other Ambulatory Visit: Payer: Self-pay | Admitting: Nurse Practitioner

## 2021-12-12 DIAGNOSIS — R110 Nausea: Secondary | ICD-10-CM

## 2021-12-12 DIAGNOSIS — M62838 Other muscle spasm: Secondary | ICD-10-CM

## 2021-12-12 DIAGNOSIS — F419 Anxiety disorder, unspecified: Secondary | ICD-10-CM

## 2022-01-10 NOTE — Progress Notes (Deleted)
6 MONTH FOLLOW UP  Assessment and Plan:   Anxiety Rare xanax PRN; declines daily agent; discussed counseling  stress management techniques discussed, increase water, good sleep hygiene discussed, increase exercise, and increase veggies.   Nausea/family history of pancreatic cancer She is very concerned about possible pancreatic cancer; has had normal labs; [place] ordered and pending  She is due for colonoscopy; will place GI referral and may also discuss with them Discussed   Hereditary peripheral neuropathy (r/t ALD) Myalgia Muscle spasticity Very limiting; extended discussion; she is switching jobs next week Does perceive benefit with current medications  interested in speaking with a specialist to help guide management of sx; likely will need to go to [facility]; she will reach out to familiar neuro for recommendation;  Lifestyle discussed;  -     baclofen (LIORESAL) 10 MG tablet; Take 1 tablet (10 mg total) by mouth 3 (three) times daily. -     gabapentin (NEURONTIN) 300 MG capsule; TAKE 1-3 CAPSULES BY MOUTH AT BEDTIME AS NEEDED FOR SLEEP AND PAIN -     ALPRAZolam (XANAX) 0.25 MG tablet; Take 1 tab up to 3 times a day as needed for severe anxiety. Limit to <5 days/week to avoid addiction. Do not drive or take with alcohol.  Vitamin D deficiency Continue supplement   Hyperlipidemia Continue diet and exercise - lipid panel   Medication Management - CBC, CMP, TSH   No orders of the defined types were placed in this encounter.   Discussed med's effects and SE's. Screening labs and tests as requested with regular follow-up as recommended. Over 40 minutes of exam, counseling, chart review, and critical decision making was performed this visit.   Future Appointments  Date Time Provider Department Center  01/11/2022  9:30 AM Raynelle Dick, NP [facility] None  07/19/2022  9:00 AM Adela Glimpse, NP [facility] None    HPI  51 y.o. female  presents for 6 month follow up. 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 She started developing myalgias/weakness/stiffness around 2019 with extensive workups that were unremarkable, was referred to Dr. Lucia Gaskins. She had EMG studies on 07/31/19 which were unremarkable and was advised sx likely r/t ALD. She was recommended MRI but deferred due to cost concern. She was going to physical therapy for this, dry needling but with limited temporary benefit and stopped going due to cost.   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 Currently takes Gabapentin, Meloxicam, baclofen and the Alprazolam QHS with significant benefit. She is looking for [place] counseling.    BMI is There is no height or weight on file to calculate BMI., she has not been working on diet and exercise. Wt Readings from Last 3 Encounters:  07/19/21 126 lb 12.8 oz (57.5 kg)  12/06/20 126 lb (57.2 kg)  11/09/20 126 lb 9.6 oz (57.4 kg)   Her blood pressure has been controlled at home, today their BP is   She does not workout. She denies chest pain, shortness of breath, dizziness.   She is not on cholesterol medication and denies myalgias. Her cholesterol is at goal. The cholesterol last visit was:   Lab Results  Component Value Date   CHOL 209 (H) 07/19/2021  HDL 72 07/19/2021   LDLCALC 123 (H) 07/19/2021   TRIG 58 07/19/2021   CHOLHDL 2.9 07/19/2021   Last V5I in the office was:  Lab Results  Component Value Date   HGBA1C 5.6 07/19/2021   Patient is on Vitamin D supplement.   Lab Results  Component Value Date   VD25OH 34 07/19/2021        Current Medications:  Current Outpatient Medications on  File Prior to Visit  Medication Sig Dispense Refill   acyclovir (ZOVIRAX) 800 MG tablet TAKE ONE TABLET BY MOUTH DAILY AS NEEDED 90 tablet 2   ALPRAZolam (XANAX) 0.5 MG tablet TAKE 1 TABLET BY MOUTH UP TO TWICE DAILY AS NEEDED FOR SEVERE ANXIETY. LIMIT TO LESS THAN 5 DAYS A WEEK TO AVOID ADDICTION. 45 tablet 0   baclofen (LIORESAL) 10 MG tablet TAKE ONE TABLET BY MOUTH THREE TIMES DAILY 90 tablet 2   Cholecalciferol (VITAMIN D-3) 25 MCG (1000 UT) CAPS Take 1 capsule (1,000 Units total) by mouth daily.     famotidine (PEPCID) 20 MG tablet Take 1 tab prior to breakfast and supper for reflux. 60 tablet 2   gabapentin (NEURONTIN) 300 MG capsule TAKE 1 to 3 CAPSULES by mouth AT BEDTIME AS NEEDED FOR SLEEP AND PAIN 90 capsule 2   ibuprofen (ADVIL) 800 MG tablet Take 1 tablet (800 mg total) by mouth every 8 (eight) hours as needed. Take with food, avoid excess use as stressful for kidneys. 90 tablet 2   Magnesium 250 MG TABS Take 1 tablet by mouth daily. (Patient not taking: Reported on 09/06/2020)     Multiple Vitamins-Minerals (MULTIVITAMIN ADULT) CHEW Chew by mouth. (Patient not taking: Reported on 09/06/2020)     ondansetron (ZOFRAN) 4 MG tablet Take 1 tablet (4 mg total) by mouth every 8 (eight) hours as needed for nausea or vomiting. (Patient not taking: Reported on 09/06/2020) 90 tablet 0   promethazine (PHENERGAN) 25 MG tablet Take 0.5-1 tablets (12.5-25 mg total) by mouth every 6 (six) hours as needed for nausea or vomiting (can cause fatigue). Max: 4 tablets per day 60 tablet 0   traZODone (DESYREL) 50 MG tablet 1-2 tablet for sleep. 60 tablet 0   No current facility-administered medications on file prior to visit.   Allergies: No Known Allergies Medical History:  Past Medical History:  Diagnosis Date   Adrenolekodystrophy (ALD) carrier 09/21/2017   Adrenoleukodystrophy (ALD) carrier   Anxiety    DDD (degenerative disc disease)    Diverticulosis    GERD (gastroesophageal reflux disease)     Neuromuscular disorder (HCC)    neuropathy   Surgical History:  Past Surgical History:  Procedure Laterality Date   SPINE SURGERY  2007   L4-L5   TUBAL LIGATION     Family History:  Family History  Problem Relation Age of Onset   Pancreatic cancer Mother 39   Lung cancer Father 38       smoker   Melanoma Sister    Other Maternal Grandmother        ADL carrier   Lung cancer Paternal Grandmother        smoker   Seizures Daughter    Other Maternal Uncle 12       ADL   Cancer Paternal Uncle    Melanoma Paternal Uncle    Colon cancer Neg Hx    Colon polyps Neg Hx    Esophageal cancer Neg Hx    Rectal cancer Neg Hx    Stomach cancer  Neg Hx     Review of Systems: Review of Systems  Constitutional:  Positive for malaise/fatigue. Negative for chills, diaphoresis, fever and weight loss.  HENT:  Negative for congestion, ear discharge, ear pain, hearing loss, nosebleeds, sore throat and tinnitus.   Eyes: Negative.   Respiratory: Negative.  Negative for stridor.   Cardiovascular: Negative.   Gastrointestinal:  Positive for nausea (mild, intermittent). Negative for abdominal pain, blood in stool, constipation, diarrhea, heartburn, melena and vomiting.  Genitourinary: Negative.   Musculoskeletal:  Positive for back pain (chronic) and myalgias. Negative for falls, joint pain and neck pain.  Skin: Negative.   Neurological:  Positive for tingling (bil feet, chronic). Negative for dizziness, tremors, sensory change, speech change, focal weakness, seizures, loss of consciousness, weakness and headaches.       Decreased sensation bil feet  Psychiatric/Behavioral:  Negative for depression, hallucinations, memory loss, substance abuse and suicidal ideas. The patient has insomnia (improved with medications). The patient is not nervous/anxious.     Physical Exam: Estimated body mass index is 21.77 kg/m as calculated from the following:   Height as of 07/19/21: 5\' 4"  (1.626 m).   Weight as  of 07/19/21: 126 lb 12.8 oz (57.5 kg). There were no vitals taken for this visit. General Appearance: Well nourished, in no apparent distress.  Eyes: PERRLA, EOMs, conjunctiva no swelling or erythema Sinuses: No Frontal/maxillary tenderness  ENT/Mouth: Ext aud canals clear, normal light reflex with TMs without erythema, bulging. Good dentition. No erythema, swelling, or exudate on post pharynx. Tonsils not swollen or erythematous. Hearing normal.  Neck: Supple, thyroid normal. No bruits  Respiratory: Respiratory effort normal, BS equal bilaterally without rales, rhonchi, wheezing or stridor.  Cardio: RRR without murmurs, rubs or gallops. Brisk peripheral pulses without edema.  Chest: symmetric, with normal excursions and percussion.  Abdomen: Soft, nontender, no guarding, rebound, hernias, masses, or organomegaly.  Lymphatics: Non tender without lymphadenopathy.  Musculoskeletal: Symmetrical ROM all peripheral extremities, some limitations in shoulders bil, improved with passive, 5/5 strength but with spacticity against resistance,  normal gait.  Skin: Warm, dry without rashes, lesions, ecchymosis.  Neuro: Cranial nerves intact, reflexes equal bilaterally. Normal muscle tone, no cerebellar symptoms. Sensation dulled to monofilament bil feet and ankles  Psych: Awake and oriented X 3, normal affect, Insight and Judgment appropriate.   Preslea Rhodus E  11:07 AM [facility]

## 2022-01-11 ENCOUNTER — Ambulatory Visit: Payer: 59 | Admitting: Nurse Practitioner

## 2022-01-11 DIAGNOSIS — Z148 Genetic carrier of other disease: Secondary | ICD-10-CM

## 2022-01-11 DIAGNOSIS — G609 Hereditary and idiopathic neuropathy, unspecified: Secondary | ICD-10-CM

## 2022-01-11 DIAGNOSIS — E782 Mixed hyperlipidemia: Secondary | ICD-10-CM

## 2022-01-11 DIAGNOSIS — Z79899 Other long term (current) drug therapy: Secondary | ICD-10-CM

## 2022-01-11 DIAGNOSIS — E559 Vitamin D deficiency, unspecified: Secondary | ICD-10-CM

## 2022-01-11 DIAGNOSIS — F419 Anxiety disorder, unspecified: Secondary | ICD-10-CM

## 2022-01-11 DIAGNOSIS — M791 Myalgia, unspecified site: Secondary | ICD-10-CM

## 2022-01-14 ENCOUNTER — Other Ambulatory Visit: Payer: Self-pay | Admitting: Adult Health Nurse Practitioner

## 2022-01-14 ENCOUNTER — Other Ambulatory Visit: Payer: Self-pay | Admitting: Nurse Practitioner

## 2022-01-14 DIAGNOSIS — G609 Hereditary and idiopathic neuropathy, unspecified: Secondary | ICD-10-CM

## 2022-01-14 DIAGNOSIS — F419 Anxiety disorder, unspecified: Secondary | ICD-10-CM

## 2022-01-14 DIAGNOSIS — M6289 Other specified disorders of muscle: Secondary | ICD-10-CM

## 2022-01-16 ENCOUNTER — Other Ambulatory Visit: Payer: Self-pay

## 2022-01-16 DIAGNOSIS — G4700 Insomnia, unspecified: Secondary | ICD-10-CM

## 2022-01-16 MED ORDER — FAMOTIDINE 20 MG PO TABS: ORAL_TABLET | ORAL | 2 refills | Status: AC

## 2022-01-16 MED ORDER — TRAZODONE HCL 50 MG PO TABS: ORAL_TABLET | ORAL | 2 refills | Status: AC

## 2022-01-18 ENCOUNTER — Other Ambulatory Visit: Payer: Self-pay | Admitting: Adult Health Nurse Practitioner

## 2022-01-18 DIAGNOSIS — M6289 Other specified disorders of muscle: Secondary | ICD-10-CM

## 2022-02-17 ENCOUNTER — Ambulatory Visit: Payer: 59 | Admitting: Nurse Practitioner

## 2022-02-18 ENCOUNTER — Other Ambulatory Visit: Payer: Self-pay | Admitting: Nurse Practitioner

## 2022-02-18 DIAGNOSIS — F419 Anxiety disorder, unspecified: Secondary | ICD-10-CM

## 2022-03-06 ENCOUNTER — Other Ambulatory Visit: Payer: Self-pay | Admitting: Nurse Practitioner

## 2022-03-06 DIAGNOSIS — R110 Nausea: Secondary | ICD-10-CM

## 2022-03-07 ENCOUNTER — Ambulatory Visit: Payer: 59 | Admitting: Nurse Practitioner

## 2022-03-14 ENCOUNTER — Ambulatory Visit (INDEPENDENT_AMBULATORY_CARE_PROVIDER_SITE_OTHER): Payer: 59 | Admitting: Nurse Practitioner

## 2022-03-14 ENCOUNTER — Encounter: Payer: Self-pay | Admitting: Nurse Practitioner

## 2022-03-14 VITALS — BP 128/76 | HR 80 | Temp 97.5°F | Ht 64.0 in | Wt 133.8 lb

## 2022-03-14 DIAGNOSIS — Z148 Genetic carrier of other disease: Secondary | ICD-10-CM

## 2022-03-14 DIAGNOSIS — E559 Vitamin D deficiency, unspecified: Secondary | ICD-10-CM | POA: Diagnosis not present

## 2022-03-14 DIAGNOSIS — G609 Hereditary and idiopathic neuropathy, unspecified: Secondary | ICD-10-CM | POA: Diagnosis not present

## 2022-03-14 DIAGNOSIS — M791 Myalgia, unspecified site: Secondary | ICD-10-CM

## 2022-03-14 DIAGNOSIS — G4700 Insomnia, unspecified: Secondary | ICD-10-CM

## 2022-03-14 DIAGNOSIS — K219 Gastro-esophageal reflux disease without esophagitis: Secondary | ICD-10-CM

## 2022-03-14 DIAGNOSIS — F419 Anxiety disorder, unspecified: Secondary | ICD-10-CM

## 2022-03-14 DIAGNOSIS — E782 Mixed hyperlipidemia: Secondary | ICD-10-CM

## 2022-03-14 DIAGNOSIS — R110 Nausea: Secondary | ICD-10-CM

## 2022-03-14 DIAGNOSIS — Z79899 Other long term (current) drug therapy: Secondary | ICD-10-CM

## 2022-03-14 NOTE — Progress Notes (Signed)
FOLLOW UP  Assessment and Plan:   Mixed hyperlipidemia Discussed lifestyle modifications. Recommended diet heavy in fruits and veggies, omega 3's. Decrease consumption of animal meats, cheeses, and dairy products. Remain active and exercise as tolerated. Continue to monitor. Check lipids/TSH   Vitamin D deficiency Continue supplement Check and monitor vitamin d levels  Hereditary peripheral neuropathy (r/t ALD) Myalgia Muscle spasticity Improved  Medications manage neuropathy. Continue PT/medical massage this year but declines referral at this visit Lifestyle discussed;  Continue Baclofen, Gabapentin, Xanax, PRN - limit use of benzo.  Acid reflux/nausea Continue famotidine PRN No suspected reflux complications ([place]/stricture). Lifestyle modification:  wt loss, avoid meals 2-3h before bedtime. Consider eliminating food triggers:  chocolate, caffeine, EtOH, acid/spicy food.   Insomnia/anxiety Medications Gabapentin effective.  Continue trazodone 50-100 mg PRN She will try to taper off on xanax use Stress management techniques discussed, increase water, good sleep hygiene discussed, increase exercise, and increase veggies.   Medication management All medications discussed and reviewed in full. All questions and concerns regarding medications addressed.    Orders Placed This Encounter  Procedures   CBC with Differential/Platelet   Lipid panel   VITAMIN D 25 Hydroxy (Vit-D Deficiency, Fractures)   COMPLETE METABOLIC PANEL WITH GFR    Discussed med's effects and SE's. Screening labs and tests as requested with regular follow-up as recommended.  Over 20 minutes of exam, counseling, chart review, and critical decision making was performed this visit.   Future Appointments  Date Time Provider [place]  07/19/2022  9:00 AM Darrol Jump, NP [facility] None     HPI  51 y.o. female  presents for a complete physical. She has Adrenolekodystrophy (ALD)  carrier; Other abnormal glucose (hx of prediabetes) ; Myalgia; Anxiety; Hereditary peripheral neuropathy; Diverticula of colon; and Gastroesophageal reflux disease on their problem list.  She follows with [facility], she is getting annual mammograms with breast center, last 10/20/2020.   She has an upcoming appt.  Daughter on disability due to seizures and lives with her.  She will be taking her daughter to [facility] 03/24/2022.  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 She started developing myalgias/weakness/stiffness around 2019 with extensive workups that were unremarkable, was referred to Dr. Jaynee Eagles. She had EMG studies on 07/31/19 which were unremarkable and was advised sx r/t ALD. She was going to physical therapy, dry needling but with limited temporary benefit and stopped going due to cost. She takes gabapentin at night for sleep and pain; baclofen and PRN alprazolam with some benefit. 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   She has intermittent ongoing nausea that she feels is more r/t hormonal changes/menopause.  Less nausea now. Had unremarkable CBC, CMP/GFR, lipase, [place] in 2022. She reports famotidine that was prescribed does seem to be helping. She was evaluated by GI Dr. Loletha Carrow and had colonoscopy 12/06/2020 which was benign other than diverticuloses.   BMI is Body mass index is 22.97 kg/m., she has not been working on diet and exercise. Wt Readings from Last 3 Encounters:  03/14/22 133 lb 12.8 oz (60.7 kg)  07/19/21 126 lb 12.8 oz (57.5 kg)  12/06/20 126 lb (57.2 kg)   Her blood pressure has been controlled at home, today their BP is BP: 128/76 She does not workout. She denies chest pain, shortness of breath, dizziness.   She is not on cholesterol medication and denies myalgias. Her cholesterol is at goal. The cholesterol last visit was:  Lab Results  Component Value Date   CHOL 209 (H)  07/19/2021   HDL 72 07/19/2021   LDLCALC 123 (H) 07/19/2021   TRIG 58 07/19/2021   CHOLHDL 2.9 07/19/2021   Last A1C in the office was:  Lab Results  Component Value Date   HGBA1C 5.6 07/19/2021   Patient admits hasn't been taking Vitamin D supplement since feeling nauseous. Was taking 1000 IU daily.   Lab Results  Component Value Date   VD25OH 34 07/19/2021        Current Medications:  Current Outpatient Medications on File Prior to Visit  Medication Sig Dispense Refill   acyclovir (ZOVIRAX) 800 MG tablet TAKE ONE TABLET BY MOUTH DAILY AS NEEDED 90 tablet 2   ALPRAZolam (XANAX) 0.5 MG tablet TAKE 1 TABLET BY MOUTH UP TO TWICE DAILY AS NEEDED FOR SEVERE ANXIETY. LIMIT TO LESS THAN 5 DAYS A WEEK TO AVOID ADDICTION. 45 tablet 0   baclofen (LIORESAL) 10 MG tablet TAKE ONE TABLET BY MOUTH THREE TIMES DAILY 90 tablet 2   famotidine (PEPCID) 20 MG tablet Take 1 tab prior to breakfast and supper for reflux. 60 tablet 2   gabapentin (NEURONTIN) 300 MG capsule TAKE 1-3 CAPSULES BY MOUTH AT BEDTIME AS NEEDED FOR SLEEP AND PAIN 90 capsule 2   ibuprofen (ADVIL) 800 MG tablet Take 1 tablet (800 mg total) by mouth every 8 (eight) hours as needed. Take with food, avoid excess use as stressful for kidneys. 90 tablet 2   ondansetron (ZOFRAN) 4 MG tablet Take 1 tablet (4 mg total) by mouth every 8 (eight) hours as needed for nausea or vomiting. 90 tablet 0   promethazine (PHENERGAN) 25 MG tablet Take 0.5-1 tablets (12.5-25 mg total) by mouth every 6 (six) hours as needed for nausea or vomiting (can cause fatigue). Max: 4 tablets per day 60 tablet 0   traZODone (DESYREL) 50 MG tablet 1-2 tablet for sleep. 60 tablet 2   Cholecalciferol (VITAMIN D-3) 25 MCG (1000 UT) CAPS Take 1 capsule (1,000 Units total) by mouth daily. (Patient not taking: Reported on 03/14/2022)     Magnesium 250 MG TABS Take 1 tablet by mouth daily. (Patient not taking: Reported on 09/06/2020)     Multiple Vitamins-Minerals  (MULTIVITAMIN ADULT) CHEW Chew by mouth. (Patient not taking: Reported on 09/06/2020)     No current facility-administered medications on file prior to visit.    Health Maintenance:   Immunization History  Administered Date(s) Administered   PFIZER(Purple Top)SARS-COV-2 Vaccination 01/07/2020, 01/28/2020   Tdap 07/30/2013   Health Maintenance  Topic Date Due   Zoster Vaccines- Shingrix (1 of 2) Never done   PAP SMEAR-Modifier  02/18/2019   COVID-19 Vaccine (3 - Pfizer risk series) 02/25/2020   MAMMOGRAM  10/20/2021   INFLUENZA VACCINE  Never done   TETANUS/TDAP  07/31/2023   COLONOSCOPY (Pts 45-31yrs Insurance coverage will need to be confirmed)  12/07/2030   Hepatitis C Screening  Completed   HPV VACCINES  Aged Out   HIV Screening  Discontinued    Patient Care Team: Unk Pinto, MD as PCP - General (Internal Medicine)  Allergies: No Known Allergies   Medical History:  Past Medical History:  Diagnosis Date   Adrenolekodystrophy (ALD) carrier 09/21/2017   Adrenoleukodystrophy (ALD) carrier   Anxiety    DDD (degenerative disc disease)    Diverticulosis    GERD (gastroesophageal reflux disease)    Neuromuscular disorder ([place])    neuropathy   Surgical History:  Past Surgical  History:  Procedure Laterality Date   SPINE SURGERY  2007   L4-L5   TUBAL LIGATION     Family History:  Family History  Problem Relation Age of Onset   Pancreatic cancer Mother 85   Lung cancer Father 85       smoker   Melanoma Sister    Other Maternal Grandmother        ADL carrier   Lung cancer Paternal Grandmother        smoker   Seizures Daughter    Other Maternal Uncle 12       ADL   Cancer Paternal Uncle    Melanoma Paternal Uncle    Colon cancer Neg Hx    Colon polyps Neg Hx    Esophageal cancer Neg Hx    Rectal cancer Neg Hx    Stomach cancer Neg Hx    Social History:  Social History   Tobacco Use   Smoking status: Never   Smokeless tobacco: Never  Vaping Use    Vaping Use: Never used  Substance Use Topics   Alcohol use: Not Currently    Comment: very rare   Drug use: Yes    Types: Marijuana    Comment: last 2 weeks ago    Review of Systems: Review of Systems  Constitutional:  Negative for chills, diaphoresis, fever, malaise/fatigue and weight loss.  HENT:  Negative for congestion, ear discharge, ear pain, hearing loss, nosebleeds, sore throat and tinnitus.   Eyes: Negative.  Negative for blurred vision and double vision.  Respiratory: Negative.  Negative for cough, shortness of breath, wheezing and stridor.   Cardiovascular: Negative.  Negative for chest pain, palpitations, orthopnea, claudication and leg swelling.  Gastrointestinal:  Positive for nausea (intermittent, improving with famotidine). Negative for abdominal pain, blood in stool, constipation, diarrhea, heartburn, melena and vomiting.  Genitourinary: Negative.   Musculoskeletal:  Positive for back pain (chronic) and myalgias. Negative for falls, joint pain and neck pain.  Skin: Negative.  Negative for rash.  Neurological:  Positive for tingling (bil feet, chronic). Negative for dizziness, tremors, sensory change, speech change, focal weakness, seizures, loss of consciousness, weakness and headaches.       Decreased sensation bil feet  Endo/Heme/Allergies:  Negative for polydipsia.  Psychiatric/Behavioral:  Negative for depression, hallucinations, memory loss, substance abuse and suicidal ideas. The patient has insomnia. The patient is not nervous/anxious.   All other systems reviewed and are negative.   Physical Exam: Estimated body mass index is 22.97 kg/m as calculated from the following:   Height as of this encounter: 5\' 4"  (1.626 m).   Weight as of this encounter: 133 lb 12.8 oz (60.7 kg). BP 128/76   Pulse 80   Temp (!) 97.5 F (36.4 C)   Ht 5\' 4"  (1.626 m)   Wt 133 lb 12.8 oz (60.7 kg)   SpO2 99%   BMI 22.97 kg/m  General Appearance: Well nourished, in no  apparent distress.  Eyes: PERRLA, EOMs, conjunctiva no swelling or erythema Sinuses: No Frontal/maxillary tenderness  ENT/Mouth: Ext aud canals clear, normal light reflex with TMs without erythema, bulging. Good dentition. No erythema, swelling, or exudate on post pharynx. Tonsils not swollen or erythematous. Hearing normal.  Neck: Supple, thyroid normal. No bruits  Respiratory: Respiratory effort normal, BS equal bilaterally without rales, rhonchi, wheezing or stridor.  Cardio: RRR without murmurs, rubs or gallops. Brisk peripheral pulses without edema.  Chest: symmetric, with normal excursions and percussion.  Breasts: Breasts: breasts appear normal,  no suspicious masses, no skin or nipple changes or axillary nodes Abdomen: Soft, mild epigastric tenderness, no guarding, rebound, hernias, masses, or organomegaly.  Lymphatics: Non tender without lymphadenopathy.  Genitourinary: defer to GYN Musculoskeletal: Symmetrical ROM all peripheral extremities, some limitations in shoulders bil, improved with passive, 5/5 strength but with spacticity against resistance,  normal gait.  Skin: Warm, dry without rashes, lesions, ecchymosis.  Neuro: Cranial nerves intact, reflexes equal bilaterally. Normal muscle tone, no cerebellar symptoms. Sensation dulled to monofilament bil feet and ankles  Psych: Awake and oriented X 3, normal affect, Insight and Judgment appropriate.   Ison Wichmann 9:16 AM [facility]

## 2022-03-14 NOTE — Patient Instructions (Signed)
Take Vitamin D 5,000 IU Daily Take Prenatal Multivitamin  Vitamin D Capsules or Tablets What is this medication? VITAMIN D (VAHY tuh min D) prevents and treats low vitamin D levels in your body. It works by increasing the amount of calcium absorbed by your body. Vitamin D and calcium help build and maintain the health of your bones. Vitamin D also plays an important role in supporting your immune system and brain health. This medicine may be used for other purposes; ask your health care provider or pharmacist if you have questions. COMMON BRAND NAME(S): DECARA, Deltalin, Dialyvite, Dialyvite Vitamin D, Dialyvite Vitamin D3, Drisdol, Ergo D, Happy Sunshine Vitamin D3, MAXIMUM D3, PureMark Naturals Vitamin D, Super Happy SUNSHINE Vitamin D3, Thera-D 2000, Thera-D 4000, Thera-D Rapid Repletion, THERA-D SPORT What should I tell my care team before I take this medication? They need to know if you have any of the following conditions: Cystic fibrosis Gallbladder disease High levels of calcium in the blood High levels of vitamin D in the blood Inflammatory bowel disease such, as Crohn's disease or ulcerative colitis Kidney disease Liver disease Parathyroid disease Other stomach disease An unusual or allergic reaction to vitamin D, other medications, foods, dyes, or preservatives Pregnant or trying to get pregnant Breast-feeding How should I use this medication? Take this medication by mouth with water. Take it as directed on the label at the same time every day. Take it with a meal or snack; for best results, take it with foods that contain fat (i.e., milk, yogurt, cheese). Do not use it more often than directed. Talk to your care team about the use of this medication in children. Special care may be needed. Overdosage: If you think you have taken too much of this medicine contact a poison control center or emergency room at once. NOTE: This medicine is only for you. Do not share this medicine  with others. What if I miss a dose? If you miss a dose, take it as soon as you can. If it is almost time for your next dose, take only that dose. Do not take double or extra doses. What may interact with this medication? Antacids Diuretics Magnesium supplements Medications for cholesterol, such as cholestyramine, colesevelam, or colestipol Medications for seizures, such as phenytoin, fosphenytoin Mineral oil Orlistat Phosphorus supplements Rifampin This list may not describe all possible interactions. Give your health care provider a list of all the medicines, herbs, non-prescription drugs, or dietary supplements you use. Also tell them if you smoke, drink alcohol, or use illegal drugs. Some items may interact with your medicine. What should I watch for while using this medication? Visit your care team for regular checks on your progress. You may need blood work done while you are taking this medication. Tell your care team if your symptoms do not start to get better or if they get worse. Do not take any non-prescription medications that have vitamin D, phosphorus, magnesium, or calcium including antacids while taking this medication, unless your care team says you can. The extra supplements can cause side effects. What side effects may I notice from receiving this medication? Side effects that you should report to your care team as soon as possible: Allergic reactions--skin rash, itching, hives, swelling of the face, lips, tongue, or throat High calcium level--increased thirst or amount of urine, nausea, vomiting, confusion, unusual weakness or fatigue, bone pain Side effects that usually do not require medical attention (report these to your care team if they continue or are bothersome):  Constipation Loss of appetite Nausea This list may not describe all possible side effects. Call your doctor for medical advice about side effects. You may report side effects to FDA at  1-800-FDA-1088. Where should I keep my medication? Keep out of the reach of children and pets. Store at room temperature between 15 and 30 degrees C (59 and 86 degrees F). Protect from light. Get rid of any unused medication after the expiration date. To get rid of medications that are no longer needed or have expired: Take the medication to a medication take-back program. Check with your pharmacy or law enforcement to find a location. If you cannot return the medication, check the label or package insert to see if the medication should be thrown out in the garbage or flushed down the toilet. If you are not sure, ask your care team. If it is safe to put it in the trash, take the medication out of the container. Mix the medication with cat litter, dirt, coffee grounds, or other unwanted substance. Seal the mixture in a bag or container. Put it in the trash. NOTE: This sheet is a summary. It may not cover all possible information. If you have questions about this medicine, talk to your doctor, pharmacist, or health care provider.  2023 [employer]/Gold Standard (2007-07-27 00:00:00)

## 2022-03-15 LAB — COMPLETE METABOLIC PANEL WITH GFR
AG Ratio: 1.8 (calc) (ref 1.0–2.5)
ALT: 18 U/L (ref 6–29)
AST: 17 U/L (ref 10–35)
Albumin: 4.6 g/dL (ref 3.6–5.1)
Alkaline phosphatase (APISO): 100 U/L (ref 37–153)
BUN: 10 mg/dL (ref 7–25)
CO2: 28 mmol/L (ref 20–32)
Calcium: 9.5 mg/dL (ref 8.6–10.4)
Chloride: 104 mmol/L (ref 98–110)
Creat: 0.78 mg/dL (ref 0.50–1.03)
Globulin: 2.5 g/dL (calc) (ref 1.9–3.7)
Glucose, Bld: 94 mg/dL (ref 65–99)
Potassium: 4.2 mmol/L (ref 3.5–5.3)
Sodium: 140 mmol/L (ref 135–146)
Total Bilirubin: 0.4 mg/dL (ref 0.2–1.2)
Total Protein: 7.1 g/dL (ref 6.1–8.1)
eGFR: 91 mL/min/{1.73_m2} (ref >=60)

## 2022-03-15 LAB — CBC WITH DIFFERENTIAL/PLATELET
Absolute Monocytes: 256 cells/uL (ref 200–950)
Basophils Absolute: 29 cells/uL (ref 0–200)
Basophils Relative: 0.7 %
Eosinophils Absolute: 38 cells/uL (ref 15–500)
Eosinophils Relative: 0.9 %
HCT: 38.5 % (ref 35.0–45.0)
Hemoglobin: 13 g/dL (ref 11.7–15.5)
Lymphs Abs: 1495 cells/uL (ref 850–3900)
MCH: 28.4 pg (ref 27.0–33.0)
MCHC: 33.8 g/dL (ref 32.0–36.0)
MCV: 84.2 fL (ref 80.0–100.0)
MPV: 9.8 fL (ref 7.5–12.5)
Monocytes Relative: 6.1 %
Neutro Abs: 2381 cells/uL (ref 1500–7800)
Neutrophils Relative %: 56.7 %
Platelets: 258 10*3/uL (ref 140–400)
RBC: 4.57 10*6/uL (ref 3.80–5.10)
RDW: 12.9 % (ref 11.0–15.0)
Total Lymphocyte: 35.6 %
WBC: 4.2 10*3/uL (ref 3.8–10.8)

## 2022-03-15 LAB — LIPID PANEL
Cholesterol: 222 mg/dL — ABNORMAL HIGH (ref <200)
HDL: 80 mg/dL (ref >=50)
LDL Cholesterol (Calc): 124 mg/dL (calc) — ABNORMAL HIGH
Non-HDL Cholesterol (Calc): 142 mg/dL (calc) — ABNORMAL HIGH (ref <130)
Total CHOL/HDL Ratio: 2.8 (calc) (ref <5.0)
Triglycerides: 79 mg/dL (ref <150)

## 2022-03-15 LAB — VITAMIN D 25 HYDROXY (VIT D DEFICIENCY, FRACTURES): Vit D, 25-Hydroxy: 29 ng/mL — ABNORMAL LOW (ref 30–100)

## 2022-03-23 ENCOUNTER — Other Ambulatory Visit: Payer: Self-pay | Admitting: Nurse Practitioner

## 2022-03-23 DIAGNOSIS — G609 Hereditary and idiopathic neuropathy, unspecified: Secondary | ICD-10-CM

## 2022-03-23 DIAGNOSIS — M791 Myalgia, unspecified site: Secondary | ICD-10-CM

## 2022-03-23 DIAGNOSIS — F419 Anxiety disorder, unspecified: Secondary | ICD-10-CM

## 2022-03-23 DIAGNOSIS — M62838 Other muscle spasm: Secondary | ICD-10-CM

## 2022-03-23 MED ORDER — IBUPROFEN 800 MG PO TABS: 800.0000 mg | ORAL_TABLET | Freq: Three times a day (TID) | ORAL | 2 refills | Status: AC | PRN

## 2022-04-28 ENCOUNTER — Other Ambulatory Visit: Payer: Self-pay | Admitting: Nurse Practitioner

## 2022-04-28 DIAGNOSIS — G609 Hereditary and idiopathic neuropathy, unspecified: Secondary | ICD-10-CM

## 2022-04-28 DIAGNOSIS — F419 Anxiety disorder, unspecified: Secondary | ICD-10-CM

## 2022-05-18 ENCOUNTER — Other Ambulatory Visit: Payer: Self-pay | Admitting: Nurse Practitioner

## 2022-05-18 DIAGNOSIS — R110 Nausea: Secondary | ICD-10-CM

## 2022-05-24 ENCOUNTER — Encounter: Payer: Self-pay | Admitting: Nurse Practitioner

## 2022-05-25 ENCOUNTER — Other Ambulatory Visit: Payer: Self-pay | Admitting: Obstetrics and Gynecology

## 2022-05-29 ENCOUNTER — Other Ambulatory Visit: Payer: Self-pay | Admitting: Nurse Practitioner

## 2022-05-29 DIAGNOSIS — F419 Anxiety disorder, unspecified: Secondary | ICD-10-CM

## 2022-06-24 ENCOUNTER — Other Ambulatory Visit: Payer: Self-pay | Admitting: Nurse Practitioner

## 2022-06-24 DIAGNOSIS — F419 Anxiety disorder, unspecified: Secondary | ICD-10-CM

## 2022-06-24 DIAGNOSIS — M62838 Other muscle spasm: Secondary | ICD-10-CM

## 2022-07-19 ENCOUNTER — Encounter: Payer: 59 | Admitting: Nurse Practitioner

## 2022-07-24 ENCOUNTER — Other Ambulatory Visit: Payer: Self-pay | Admitting: Nurse Practitioner

## 2022-07-24 DIAGNOSIS — G609 Hereditary and idiopathic neuropathy, unspecified: Secondary | ICD-10-CM

## 2022-07-24 DIAGNOSIS — F419 Anxiety disorder, unspecified: Secondary | ICD-10-CM

## 2022-07-24 MED ORDER — ALPRAZOLAM 0.5 MG PO TABS: ORAL_TABLET | ORAL | 0 refills | Status: DC

## 2022-08-22 ENCOUNTER — Other Ambulatory Visit: Payer: Self-pay | Admitting: Nurse Practitioner

## 2022-08-22 DIAGNOSIS — F419 Anxiety disorder, unspecified: Secondary | ICD-10-CM

## 2022-08-24 DIAGNOSIS — H90A22 Sensorineural hearing loss, unilateral, left ear, with restricted hearing on the contralateral side: Secondary | ICD-10-CM | POA: Diagnosis not present

## 2022-08-24 DIAGNOSIS — Z01118 Encounter for examination of ears and hearing with other abnormal findings: Secondary | ICD-10-CM | POA: Diagnosis not present

## 2022-08-24 DIAGNOSIS — H90A31 Mixed conductive and sensorineural hearing loss, unilateral, right ear with restricted hearing on the contralateral side: Secondary | ICD-10-CM | POA: Diagnosis not present

## 2022-08-30 ENCOUNTER — Other Ambulatory Visit: Payer: Self-pay | Admitting: Nurse Practitioner

## 2022-08-30 DIAGNOSIS — R110 Nausea: Secondary | ICD-10-CM

## 2022-10-03 ENCOUNTER — Other Ambulatory Visit: Payer: Self-pay | Admitting: Nurse Practitioner

## 2022-10-03 ENCOUNTER — Encounter: Payer: Self-pay | Admitting: Nurse Practitioner

## 2022-10-03 DIAGNOSIS — F419 Anxiety disorder, unspecified: Secondary | ICD-10-CM

## 2022-10-03 DIAGNOSIS — M62838 Other muscle spasm: Secondary | ICD-10-CM

## 2022-10-18 ENCOUNTER — Ambulatory Visit: Payer: 59 | Admitting: Nurse Practitioner

## 2022-10-20 DIAGNOSIS — H903 Sensorineural hearing loss, bilateral: Secondary | ICD-10-CM | POA: Diagnosis not present

## 2022-10-20 DIAGNOSIS — Z461 Encounter for fitting and adjustment of hearing aid: Secondary | ICD-10-CM | POA: Diagnosis not present

## 2023-01-16 ENCOUNTER — Ambulatory Visit: Payer: 59 | Admitting: Nurse Practitioner

## 2023-01-17 ENCOUNTER — Ambulatory Visit: Payer: 59 | Admitting: Nurse Practitioner

## 2023-01-26 ENCOUNTER — Ambulatory Visit: Payer: 59 | Admitting: Nurse Practitioner

## 2023-05-29 ENCOUNTER — Encounter: Payer: Self-pay | Admitting: Nurse Practitioner

## 2023-07-12 DIAGNOSIS — Z808 Family history of malignant neoplasm of other organs or systems: Secondary | ICD-10-CM | POA: Diagnosis not present

## 2023-07-12 DIAGNOSIS — Z124 Encounter for screening for malignant neoplasm of cervix: Secondary | ICD-10-CM | POA: Diagnosis not present

## 2023-07-12 DIAGNOSIS — Z6821 Body mass index (BMI) 21.0-21.9, adult: Secondary | ICD-10-CM | POA: Diagnosis not present

## 2023-07-12 DIAGNOSIS — Z01419 Encounter for gynecological examination (general) (routine) without abnormal findings: Secondary | ICD-10-CM | POA: Diagnosis not present

## 2023-07-12 DIAGNOSIS — Z1231 Encounter for screening mammogram for malignant neoplasm of breast: Secondary | ICD-10-CM | POA: Diagnosis not present

## 2023-07-20 ENCOUNTER — Encounter: Payer: 59 | Admitting: Nurse Practitioner

## 2023-08-06 DIAGNOSIS — Z23 Encounter for immunization: Secondary | ICD-10-CM | POA: Diagnosis not present

## 2023-08-06 DIAGNOSIS — Z1389 Encounter for screening for other disorder: Secondary | ICD-10-CM | POA: Diagnosis not present

## 2023-08-06 DIAGNOSIS — Z13220 Encounter for screening for lipoid disorders: Secondary | ICD-10-CM | POA: Diagnosis not present

## 2023-08-06 DIAGNOSIS — R9230 Dense breasts, unspecified: Secondary | ICD-10-CM | POA: Diagnosis not present

## 2023-08-06 DIAGNOSIS — Z0000 Encounter for general adult medical examination without abnormal findings: Secondary | ICD-10-CM | POA: Diagnosis not present

## 2023-08-07 DIAGNOSIS — R9230 Dense breasts, unspecified: Secondary | ICD-10-CM | POA: Diagnosis not present

## 2023-09-13 DIAGNOSIS — M5116 Intervertebral disc disorders with radiculopathy, lumbar region: Secondary | ICD-10-CM | POA: Diagnosis not present

## 2023-09-13 DIAGNOSIS — M9905 Segmental and somatic dysfunction of pelvic region: Secondary | ICD-10-CM | POA: Diagnosis not present

## 2023-09-13 DIAGNOSIS — M9903 Segmental and somatic dysfunction of lumbar region: Secondary | ICD-10-CM | POA: Diagnosis not present

## 2023-09-13 DIAGNOSIS — M25552 Pain in left hip: Secondary | ICD-10-CM | POA: Diagnosis not present

## 2023-09-18 DIAGNOSIS — B009 Herpesviral infection, unspecified: Secondary | ICD-10-CM | POA: Diagnosis not present

## 2023-09-18 DIAGNOSIS — M25562 Pain in left knee: Secondary | ICD-10-CM | POA: Diagnosis not present

## 2023-09-18 DIAGNOSIS — Z6824 Body mass index (BMI) 24.0-24.9, adult: Secondary | ICD-10-CM | POA: Diagnosis not present

## 2023-09-20 DIAGNOSIS — M25562 Pain in left knee: Secondary | ICD-10-CM | POA: Diagnosis not present

## 2023-09-20 DIAGNOSIS — M5116 Intervertebral disc disorders with radiculopathy, lumbar region: Secondary | ICD-10-CM | POA: Diagnosis not present

## 2023-09-25 DIAGNOSIS — M9905 Segmental and somatic dysfunction of pelvic region: Secondary | ICD-10-CM | POA: Diagnosis not present

## 2023-09-25 DIAGNOSIS — M9903 Segmental and somatic dysfunction of lumbar region: Secondary | ICD-10-CM | POA: Diagnosis not present

## 2023-09-25 DIAGNOSIS — M5116 Intervertebral disc disorders with radiculopathy, lumbar region: Secondary | ICD-10-CM | POA: Diagnosis not present

## 2023-09-25 DIAGNOSIS — M25552 Pain in left hip: Secondary | ICD-10-CM | POA: Diagnosis not present

## 2023-09-27 DIAGNOSIS — M5116 Intervertebral disc disorders with radiculopathy, lumbar region: Secondary | ICD-10-CM | POA: Diagnosis not present

## 2023-09-27 DIAGNOSIS — M9903 Segmental and somatic dysfunction of lumbar region: Secondary | ICD-10-CM | POA: Diagnosis not present

## 2023-09-27 DIAGNOSIS — M9905 Segmental and somatic dysfunction of pelvic region: Secondary | ICD-10-CM | POA: Diagnosis not present

## 2023-09-27 DIAGNOSIS — M25552 Pain in left hip: Secondary | ICD-10-CM | POA: Diagnosis not present

## 2023-10-01 DIAGNOSIS — M5116 Intervertebral disc disorders with radiculopathy, lumbar region: Secondary | ICD-10-CM | POA: Diagnosis not present

## 2023-10-01 DIAGNOSIS — M25552 Pain in left hip: Secondary | ICD-10-CM | POA: Diagnosis not present

## 2023-10-01 DIAGNOSIS — M9905 Segmental and somatic dysfunction of pelvic region: Secondary | ICD-10-CM | POA: Diagnosis not present

## 2023-10-01 DIAGNOSIS — M9903 Segmental and somatic dysfunction of lumbar region: Secondary | ICD-10-CM | POA: Diagnosis not present

## 2023-10-02 DIAGNOSIS — M9903 Segmental and somatic dysfunction of lumbar region: Secondary | ICD-10-CM | POA: Diagnosis not present

## 2023-10-02 DIAGNOSIS — M5116 Intervertebral disc disorders with radiculopathy, lumbar region: Secondary | ICD-10-CM | POA: Diagnosis not present

## 2023-10-02 DIAGNOSIS — M9905 Segmental and somatic dysfunction of pelvic region: Secondary | ICD-10-CM | POA: Diagnosis not present

## 2023-10-02 DIAGNOSIS — M25552 Pain in left hip: Secondary | ICD-10-CM | POA: Diagnosis not present

## 2023-10-03 DIAGNOSIS — M5116 Intervertebral disc disorders with radiculopathy, lumbar region: Secondary | ICD-10-CM | POA: Diagnosis not present

## 2023-10-03 DIAGNOSIS — M9905 Segmental and somatic dysfunction of pelvic region: Secondary | ICD-10-CM | POA: Diagnosis not present

## 2023-10-03 DIAGNOSIS — M25552 Pain in left hip: Secondary | ICD-10-CM | POA: Diagnosis not present

## 2023-10-03 DIAGNOSIS — M9903 Segmental and somatic dysfunction of lumbar region: Secondary | ICD-10-CM | POA: Diagnosis not present

## 2023-10-08 DIAGNOSIS — M9903 Segmental and somatic dysfunction of lumbar region: Secondary | ICD-10-CM | POA: Diagnosis not present

## 2023-10-08 DIAGNOSIS — M25552 Pain in left hip: Secondary | ICD-10-CM | POA: Diagnosis not present

## 2023-10-08 DIAGNOSIS — M9905 Segmental and somatic dysfunction of pelvic region: Secondary | ICD-10-CM | POA: Diagnosis not present

## 2023-10-08 DIAGNOSIS — M5116 Intervertebral disc disorders with radiculopathy, lumbar region: Secondary | ICD-10-CM | POA: Diagnosis not present

## 2023-10-10 DIAGNOSIS — M25552 Pain in left hip: Secondary | ICD-10-CM | POA: Diagnosis not present

## 2023-10-10 DIAGNOSIS — M9905 Segmental and somatic dysfunction of pelvic region: Secondary | ICD-10-CM | POA: Diagnosis not present

## 2023-10-10 DIAGNOSIS — M5116 Intervertebral disc disorders with radiculopathy, lumbar region: Secondary | ICD-10-CM | POA: Diagnosis not present

## 2023-10-10 DIAGNOSIS — M9903 Segmental and somatic dysfunction of lumbar region: Secondary | ICD-10-CM | POA: Diagnosis not present

## 2023-10-11 DIAGNOSIS — M9905 Segmental and somatic dysfunction of pelvic region: Secondary | ICD-10-CM | POA: Diagnosis not present

## 2023-10-11 DIAGNOSIS — M5116 Intervertebral disc disorders with radiculopathy, lumbar region: Secondary | ICD-10-CM | POA: Diagnosis not present

## 2023-10-11 DIAGNOSIS — M25552 Pain in left hip: Secondary | ICD-10-CM | POA: Diagnosis not present

## 2023-10-11 DIAGNOSIS — M9903 Segmental and somatic dysfunction of lumbar region: Secondary | ICD-10-CM | POA: Diagnosis not present

## 2023-10-15 DIAGNOSIS — M5116 Intervertebral disc disorders with radiculopathy, lumbar region: Secondary | ICD-10-CM | POA: Diagnosis not present

## 2023-10-15 DIAGNOSIS — M25552 Pain in left hip: Secondary | ICD-10-CM | POA: Diagnosis not present

## 2023-10-15 DIAGNOSIS — M9905 Segmental and somatic dysfunction of pelvic region: Secondary | ICD-10-CM | POA: Diagnosis not present

## 2023-10-15 DIAGNOSIS — M9903 Segmental and somatic dysfunction of lumbar region: Secondary | ICD-10-CM | POA: Diagnosis not present

## 2023-10-18 DIAGNOSIS — M9905 Segmental and somatic dysfunction of pelvic region: Secondary | ICD-10-CM | POA: Diagnosis not present

## 2023-10-18 DIAGNOSIS — M9903 Segmental and somatic dysfunction of lumbar region: Secondary | ICD-10-CM | POA: Diagnosis not present

## 2023-10-18 DIAGNOSIS — M25552 Pain in left hip: Secondary | ICD-10-CM | POA: Diagnosis not present

## 2023-10-18 DIAGNOSIS — M5116 Intervertebral disc disorders with radiculopathy, lumbar region: Secondary | ICD-10-CM | POA: Diagnosis not present

## 2023-10-22 DIAGNOSIS — M9905 Segmental and somatic dysfunction of pelvic region: Secondary | ICD-10-CM | POA: Diagnosis not present

## 2023-10-22 DIAGNOSIS — M5116 Intervertebral disc disorders with radiculopathy, lumbar region: Secondary | ICD-10-CM | POA: Diagnosis not present

## 2023-10-22 DIAGNOSIS — M25552 Pain in left hip: Secondary | ICD-10-CM | POA: Diagnosis not present

## 2023-10-22 DIAGNOSIS — M9903 Segmental and somatic dysfunction of lumbar region: Secondary | ICD-10-CM | POA: Diagnosis not present

## 2023-10-25 DIAGNOSIS — M9903 Segmental and somatic dysfunction of lumbar region: Secondary | ICD-10-CM | POA: Diagnosis not present

## 2023-10-25 DIAGNOSIS — M25552 Pain in left hip: Secondary | ICD-10-CM | POA: Diagnosis not present

## 2023-10-25 DIAGNOSIS — M5116 Intervertebral disc disorders with radiculopathy, lumbar region: Secondary | ICD-10-CM | POA: Diagnosis not present

## 2023-10-25 DIAGNOSIS — M9905 Segmental and somatic dysfunction of pelvic region: Secondary | ICD-10-CM | POA: Diagnosis not present

## 2023-11-01 DIAGNOSIS — M5116 Intervertebral disc disorders with radiculopathy, lumbar region: Secondary | ICD-10-CM | POA: Diagnosis not present

## 2023-11-01 DIAGNOSIS — M9903 Segmental and somatic dysfunction of lumbar region: Secondary | ICD-10-CM | POA: Diagnosis not present

## 2023-11-01 DIAGNOSIS — M9905 Segmental and somatic dysfunction of pelvic region: Secondary | ICD-10-CM | POA: Diagnosis not present

## 2023-11-01 DIAGNOSIS — M25552 Pain in left hip: Secondary | ICD-10-CM | POA: Diagnosis not present

## 2024-02-29 ENCOUNTER — Encounter: Payer: Self-pay | Admitting: Neurology

## 2024-05-12 ENCOUNTER — Ambulatory Visit: Admitting: Neurology
